# Patient Record
Sex: Female | Born: 1965 | Race: White | Hispanic: No | Marital: Single | Smoking: Former smoker
Health system: Southern US, Community
[De-identification: ages and names within clinical notes are randomized; demographics above are authoritative.]

## PROBLEM LIST (undated history)

## (undated) DIAGNOSIS — Z9889 Other specified postprocedural states: Secondary | ICD-10-CM

## (undated) DIAGNOSIS — I10 Essential (primary) hypertension: Secondary | ICD-10-CM

## (undated) DIAGNOSIS — F419 Anxiety disorder, unspecified: Secondary | ICD-10-CM

## (undated) DIAGNOSIS — M109 Gout, unspecified: Secondary | ICD-10-CM

## (undated) DIAGNOSIS — R7303 Prediabetes: Secondary | ICD-10-CM

## (undated) DIAGNOSIS — F410 Panic disorder [episodic paroxysmal anxiety] without agoraphobia: Secondary | ICD-10-CM

## (undated) HISTORY — PX: MENISCUS REPAIR: SHX5179

## (undated) HISTORY — PX: KNEE ARTHROSCOPY: SUR90

## (undated) HISTORY — PX: ORTHOPEDIC SURGERY: SHX850

## (undated) HISTORY — PX: MENISECTOMY: SHX5181

---

## 2010-04-22 ENCOUNTER — Emergency Department (HOSPITAL_COMMUNITY)
Admission: EM | Admit: 2010-04-22 | Discharge: 2010-04-22 | Disposition: A | Discharge status: Home / Self Care | Payer: Self-pay | Attending: Emergency Medicine | Admitting: Emergency Medicine

## 2010-04-22 DIAGNOSIS — X500XXA Overexertion from strenuous movement or load, initial encounter: Secondary | ICD-10-CM | POA: Insufficient documentation

## 2010-04-22 DIAGNOSIS — IMO0002 Reserved for concepts with insufficient information to code with codable children: Secondary | ICD-10-CM | POA: Insufficient documentation

## 2010-04-22 DIAGNOSIS — Y92838 Other recreation area as the place of occurrence of the external cause: Secondary | ICD-10-CM | POA: Insufficient documentation

## 2010-04-22 DIAGNOSIS — Y9239 Other specified sports and athletic area as the place of occurrence of the external cause: Secondary | ICD-10-CM | POA: Insufficient documentation

## 2011-08-05 ENCOUNTER — Emergency Department (HOSPITAL_COMMUNITY)
Admission: EM | Admit: 2011-08-05 | Discharge: 2011-08-05 | Discharge status: Against Medical Advice | Payer: Self-pay | Attending: Emergency Medicine | Admitting: Emergency Medicine

## 2011-08-05 ENCOUNTER — Emergency Department (HOSPITAL_COMMUNITY): Payer: Self-pay

## 2011-08-05 ENCOUNTER — Encounter (HOSPITAL_COMMUNITY): Payer: Self-pay

## 2011-08-05 DIAGNOSIS — M25529 Pain in unspecified elbow: Secondary | ICD-10-CM | POA: Insufficient documentation

## 2011-08-05 DIAGNOSIS — I10 Essential (primary) hypertension: Secondary | ICD-10-CM | POA: Insufficient documentation

## 2011-08-05 DIAGNOSIS — M109 Gout, unspecified: Secondary | ICD-10-CM | POA: Insufficient documentation

## 2011-08-05 DIAGNOSIS — M25522 Pain in left elbow: Secondary | ICD-10-CM

## 2011-08-05 HISTORY — DX: Essential (primary) hypertension: I10

## 2011-08-05 HISTORY — DX: Gout, unspecified: M10.9

## 2011-08-05 MED ORDER — KETOROLAC TROMETHAMINE 60 MG/2ML IM SOLN
60.0000 mg | Freq: Once | INTRAMUSCULAR | Status: AC
  Administered 2011-08-05: 60 mg via INTRAMUSCULAR
  Filled 2011-08-05: qty 2

## 2011-08-05 NOTE — ED Notes (Signed)
Dr.knapp to see pt.  

## 2011-08-05 NOTE — ED Notes (Signed)
Pt stated that she was given tramadol at [facility] on Sunday and didn't help was given tramadol prescription and has not helped her pain at home.

## 2011-08-05 NOTE — ED Notes (Signed)
Complain of gout in left elbow

## 2011-08-05 NOTE — ED Notes (Signed)
Consent for release of medical information from [facility] signed by pt. Papers given to sec for faxing.  Pt aware.
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
## 2011-08-05 NOTE — ED Notes (Signed)
Patient is no longer in the department, left prior to end of treatment. Did not notify staff

## 2012-07-21 DIAGNOSIS — R079 Chest pain, unspecified: Secondary | ICD-10-CM

## 2016-07-02 ENCOUNTER — Emergency Department (HOSPITAL_COMMUNITY): Payer: Self-pay

## 2016-07-02 ENCOUNTER — Encounter (HOSPITAL_COMMUNITY): Payer: Self-pay | Admitting: Emergency Medicine

## 2016-07-02 ENCOUNTER — Emergency Department (HOSPITAL_COMMUNITY)
Admission: EM | Admit: 2016-07-02 | Discharge: 2016-07-02 | Disposition: A | Discharge status: Home / Self Care | Payer: Self-pay | Attending: Emergency Medicine | Admitting: Emergency Medicine

## 2016-07-02 DIAGNOSIS — S838X1A Sprain of other specified parts of right knee, initial encounter: Secondary | ICD-10-CM

## 2016-07-02 DIAGNOSIS — I10 Essential (primary) hypertension: Secondary | ICD-10-CM | POA: Insufficient documentation

## 2016-07-02 DIAGNOSIS — Y9389 Activity, other specified: Secondary | ICD-10-CM | POA: Insufficient documentation

## 2016-07-02 DIAGNOSIS — M25561 Pain in right knee: Secondary | ICD-10-CM

## 2016-07-02 DIAGNOSIS — Y92009 Unspecified place in unspecified non-institutional (private) residence as the place of occurrence of the external cause: Secondary | ICD-10-CM | POA: Insufficient documentation

## 2016-07-02 DIAGNOSIS — S83206A Unspecified tear of unspecified meniscus, current injury, right knee, initial encounter: Secondary | ICD-10-CM | POA: Insufficient documentation

## 2016-07-02 DIAGNOSIS — Z87891 Personal history of nicotine dependence: Secondary | ICD-10-CM | POA: Insufficient documentation

## 2016-07-02 DIAGNOSIS — X58XXXA Exposure to other specified factors, initial encounter: Secondary | ICD-10-CM | POA: Insufficient documentation

## 2016-07-02 DIAGNOSIS — Y999 Unspecified external cause status: Secondary | ICD-10-CM | POA: Insufficient documentation

## 2016-07-02 MED ORDER — IBUPROFEN 600 MG PO TABS: 600.0000 mg | ORAL_TABLET | Freq: Four times a day (QID) | ORAL | 0 refills | Status: DC | PRN

## 2016-07-02 MED ORDER — HYDROCODONE-ACETAMINOPHEN 5-325 MG PO TABS: 1.0000 | ORAL_TABLET | ORAL | 0 refills | Status: DC | PRN

## 2016-07-02 NOTE — Discharge Instructions (Signed)
Wear the knee immobilizer to protect and rest your knee.  Use ice and elevation as much as possible for the next several days to help reduce the swelling.  Take the medications prescribed.  You may take the hydrocodone prescribed for pain relief.  This will make you drowsy - do not drive within 4 hours of taking this medication.  Use the ibuprofen also for inflammation.  Call the orthopedic doctor listed for a recheck of your injury as soon as possible.

## 2016-07-02 NOTE — ED Triage Notes (Signed)
Pt states that she has had 2 meniscus surgeries in the past.  Pt states that she tried to get up this morning out of bed and it has been getting harder to go up and down the steps at home.  Pt states that her knee locked out on here this morning.
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
## 2016-10-01 ENCOUNTER — Emergency Department (HOSPITAL_COMMUNITY): Payer: Self-pay

## 2016-10-01 ENCOUNTER — Encounter (HOSPITAL_COMMUNITY): Payer: Self-pay | Admitting: Cardiology

## 2016-10-01 ENCOUNTER — Emergency Department (HOSPITAL_COMMUNITY)
Admission: EM | Admit: 2016-10-01 | Discharge: 2016-10-01 | Disposition: A | Discharge status: Home / Self Care | Payer: Self-pay | Attending: Emergency Medicine | Admitting: Emergency Medicine

## 2016-10-01 DIAGNOSIS — M25562 Pain in left knee: Secondary | ICD-10-CM | POA: Insufficient documentation

## 2016-10-01 DIAGNOSIS — I10 Essential (primary) hypertension: Secondary | ICD-10-CM | POA: Insufficient documentation

## 2016-10-01 DIAGNOSIS — Z87891 Personal history of nicotine dependence: Secondary | ICD-10-CM | POA: Insufficient documentation

## 2016-10-01 DIAGNOSIS — Z23 Encounter for immunization: Secondary | ICD-10-CM | POA: Insufficient documentation

## 2016-10-01 HISTORY — DX: Panic disorder (episodic paroxysmal anxiety): F41.0

## 2016-10-01 HISTORY — DX: Anxiety disorder, unspecified: F41.9

## 2016-10-01 MED ORDER — KETOROLAC TROMETHAMINE 30 MG/ML IJ SOLN
15.0000 mg | Freq: Once | INTRAMUSCULAR | Status: AC
  Administered 2016-10-01: 15 mg via INTRAMUSCULAR
  Filled 2016-10-01: qty 1

## 2016-10-01 MED ORDER — TETANUS-DIPHTH-ACELL PERTUSSIS 5-2.5-18.5 LF-MCG/0.5 IM SUSP
0.5000 mL | Freq: Once | INTRAMUSCULAR | Status: AC
  Administered 2016-10-01: 0.5 mL via INTRAMUSCULAR
  Filled 2016-10-01: qty 0.5

## 2016-10-01 MED ORDER — HYDROCODONE-ACETAMINOPHEN 5-325 MG PO TABS: 1.0000 | ORAL_TABLET | Freq: Four times a day (QID) | ORAL | 0 refills | Status: DC | PRN

## 2016-10-01 NOTE — ED Triage Notes (Signed)
Left knee pain times one week.  Denies injury

## 2016-10-01 NOTE — Discharge Instructions (Signed)
Please read instructions below. Apply ice to your knee for 20 minutes at a time. Where your knee immobilizer brace at all times until you have followed up with your orthopedic specialist. You can take advil every 6 hours as needed for pain. Schedule an appointment with your orthopedic specialist within 1 week for follow-up on your injury. Return to the ER for new or concerning symptoms.
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
## 2016-10-17 ENCOUNTER — Encounter (HOSPITAL_COMMUNITY): Payer: Self-pay | Admitting: Emergency Medicine

## 2016-10-17 ENCOUNTER — Emergency Department (HOSPITAL_COMMUNITY)
Admission: EM | Admit: 2016-10-17 | Discharge: 2016-10-17 | Disposition: A | Discharge status: Home / Self Care | Payer: No Typology Code available for payment source | Attending: Emergency Medicine | Admitting: Emergency Medicine

## 2016-10-17 DIAGNOSIS — M545 Low back pain: Secondary | ICD-10-CM | POA: Diagnosis not present

## 2016-10-17 DIAGNOSIS — I10 Essential (primary) hypertension: Secondary | ICD-10-CM | POA: Insufficient documentation

## 2016-10-17 DIAGNOSIS — S3992XA Unspecified injury of lower back, initial encounter: Secondary | ICD-10-CM | POA: Diagnosis present

## 2016-10-17 DIAGNOSIS — Y998 Other external cause status: Secondary | ICD-10-CM | POA: Insufficient documentation

## 2016-10-17 DIAGNOSIS — Y9389 Activity, other specified: Secondary | ICD-10-CM | POA: Diagnosis not present

## 2016-10-17 DIAGNOSIS — Z87891 Personal history of nicotine dependence: Secondary | ICD-10-CM | POA: Diagnosis not present

## 2016-10-17 DIAGNOSIS — Z79899 Other long term (current) drug therapy: Secondary | ICD-10-CM | POA: Diagnosis not present

## 2016-10-17 DIAGNOSIS — Y929 Unspecified place or not applicable: Secondary | ICD-10-CM | POA: Diagnosis not present

## 2016-10-17 MED ORDER — DICLOFENAC SODIUM 50 MG PO TBEC: 50.0000 mg | DELAYED_RELEASE_TABLET | Freq: Two times a day (BID) | ORAL | 0 refills | Status: DC

## 2016-10-17 MED ORDER — METHOCARBAMOL 500 MG PO TABS: 500.0000 mg | ORAL_TABLET | Freq: Two times a day (BID) | ORAL | 0 refills | Status: DC
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
## 2016-10-17 NOTE — Discharge Instructions (Signed)
Return if any problems.
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
## 2016-10-22 ENCOUNTER — Emergency Department (HOSPITAL_COMMUNITY)
Admission: EM | Admit: 2016-10-22 | Discharge: 2016-10-22 | Disposition: A | Discharge status: Home / Self Care | Payer: No Typology Code available for payment source | Attending: Emergency Medicine | Admitting: Emergency Medicine

## 2016-10-22 ENCOUNTER — Encounter (HOSPITAL_COMMUNITY): Payer: Self-pay

## 2016-10-22 DIAGNOSIS — M25511 Pain in right shoulder: Secondary | ICD-10-CM | POA: Insufficient documentation

## 2016-10-22 DIAGNOSIS — Z5321 Procedure and treatment not carried out due to patient leaving prior to being seen by health care provider: Secondary | ICD-10-CM | POA: Insufficient documentation

## 2016-10-22 DIAGNOSIS — M25512 Pain in left shoulder: Secondary | ICD-10-CM | POA: Insufficient documentation

## 2016-10-22 DIAGNOSIS — M542 Cervicalgia: Secondary | ICD-10-CM | POA: Diagnosis present

## 2016-10-22 DIAGNOSIS — M545 Low back pain: Secondary | ICD-10-CM | POA: Insufficient documentation

## 2016-10-22 NOTE — ED Notes (Signed)
Pt not in room upon edp evaluation.

## 2016-10-22 NOTE — ED Notes (Signed)
Pt not in room on this nurse rounding.
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
## 2016-10-29 DIAGNOSIS — M1711 Unilateral primary osteoarthritis, right knee: Secondary | ICD-10-CM | POA: Insufficient documentation

## 2016-11-10 ENCOUNTER — Encounter: Payer: Self-pay | Admitting: General Practice

## 2016-11-10 ENCOUNTER — Ambulatory Visit: Payer: Self-pay | Admitting: Family Medicine

## 2017-04-29 DIAGNOSIS — M2341 Loose body in knee, right knee: Secondary | ICD-10-CM | POA: Insufficient documentation

## 2017-05-14 DIAGNOSIS — Z9889 Other specified postprocedural states: Secondary | ICD-10-CM | POA: Insufficient documentation

## 2018-04-04 ENCOUNTER — Encounter (HOSPITAL_COMMUNITY): Payer: Self-pay

## 2018-04-04 ENCOUNTER — Other Ambulatory Visit: Payer: Self-pay

## 2018-04-04 ENCOUNTER — Emergency Department (HOSPITAL_COMMUNITY)
Admission: EM | Admit: 2018-04-04 | Discharge: 2018-04-04 | Disposition: A | Discharge status: Home / Self Care | Payer: Self-pay | Attending: Emergency Medicine | Admitting: Emergency Medicine

## 2018-04-04 DIAGNOSIS — M545 Low back pain, unspecified: Secondary | ICD-10-CM

## 2018-04-04 DIAGNOSIS — Z87891 Personal history of nicotine dependence: Secondary | ICD-10-CM | POA: Insufficient documentation

## 2018-04-04 DIAGNOSIS — I10 Essential (primary) hypertension: Secondary | ICD-10-CM | POA: Insufficient documentation

## 2018-04-04 DIAGNOSIS — Z79899 Other long term (current) drug therapy: Secondary | ICD-10-CM | POA: Insufficient documentation

## 2018-04-04 MED ORDER — KETOROLAC TROMETHAMINE 30 MG/ML IJ SOLN
30.0000 mg | Freq: Once | INTRAMUSCULAR | Status: AC
  Administered 2018-04-04: 30 mg via INTRAMUSCULAR
  Filled 2018-04-04: qty 1

## 2018-04-04 MED ORDER — DEXAMETHASONE SODIUM PHOSPHATE 10 MG/ML IJ SOLN
10.0000 mg | Freq: Once | INTRAMUSCULAR | Status: AC
  Administered 2018-04-04: 10 mg via INTRAMUSCULAR
  Filled 2018-04-04: qty 1

## 2018-04-04 NOTE — ED Provider Notes (Signed)
[facility] EMERGENCY DEPARTMENT Provider Note   CSN: [id number] Arrival date & time: 04/04/18  4098     History   Chief Complaint Chief Complaint  Patient presents with  . Back Pain    HPI Taylor Ingram is a 53 y.o. female with history of hypertension, gout presenting to emergency department with chief complaint of back pain x3 weeks.  She describes the pain is located in her right lower back and radiates across to her left lower back.  She denies recent injury.  She describes the pain as sharp and constant.  She rates the pain 10 of 10 severity.  She has a history of chronic right knee pain stating that she is "bone-on-bone."  She has been taking Tylenol, hydrocodone, and over-the-counter lidocaine patches without relief. Patient works as a Financial risk analyst and she reports when standing for long hours she has pain radiating down her right leg. Denies weight loss, fever, neck pain, history of cancer, IV drug use, urinary retention, bowel or bladder incontinence, numbness or tingling.    Past Medical History:  Diagnosis Date  . Anxiety   . Gout   . Hypertension   . Panic attack     There are no active problems to display for this patient.   Past Surgical History:  Procedure Laterality Date  . KNEE ARTHROSCOPY    . MENISCUS REPAIR    . MENISECTOMY    . ORTHOPEDIC SURGERY       OB History   No obstetric history on file.      Home Medications    Prior to Admission medications   Medication Sig Start Date End Date Taking? Authorizing Provider  diclofenac (VOLTAREN) 50 MG EC tablet Take 1 tablet (50 mg total) by mouth 2 (two) times daily. 10/17/16   Elson Areas, PA-C  hydrochlorothiazide (HYDRODIURIL) 25 MG tablet Take 25 mg by mouth daily.    [provider]  HYDROcodone-acetaminophen (NORCO/VICODIN) 5-325 MG tablet Take 1 tablet by mouth every 6 (six) hours as needed for severe pain. Patient not taking: Reported on 10/22/2016 10/01/16   Robinson, [place] N, PA-C    ibuprofen (ADVIL,MOTRIN) 600 MG tablet Take 1 tablet (600 mg total) by mouth every 6 (six) hours as needed. Patient not taking: Reported on 10/22/2016 07/02/16   Burgess Amor, PA-C  methocarbamol (ROBAXIN) 500 MG tablet Take 1 tablet (500 mg total) by mouth 2 (two) times daily. 10/17/16   Elson Areas, PA-C  sertraline (ZOLOFT) 50 MG tablet Take 50 mg by mouth daily.    [provider]    Family History No family history on file.  Social History Social History   Tobacco Use  . Smoking status: Former Smoker    Types: Cigarettes    Last attempt to quit: 2016    Years since quitting: 4.1  . Smokeless tobacco: Never Used  Substance Use Topics  . Alcohol use: No  . Drug use: No     Allergies   Penicillins and Sulfa antibiotics   Review of Systems Review of Systems  Musculoskeletal: Positive for back pain. Negative for gait problem, neck pain and neck stiffness.  All other systems reviewed and are negative.    Physical Exam Updated Vital Signs BP (!) 150/96   Pulse 78   Temp 97.7 F (36.5 C) (Oral)   Resp 18   Ht 5\' 9"  (1.753 m)   Wt 114.8 kg   SpO2 99%   BMI 37.36 kg/m   Physical Exam  Vitals signs and nursing note reviewed.  Constitutional:      Appearance: She is not ill-appearing or toxic-appearing.  HENT:     Head: Normocephalic and atraumatic.     Nose: Nose normal.     Mouth/Throat:     Mouth: Mucous membranes are moist.     Pharynx: Oropharynx is clear.  Eyes:     General: No scleral icterus.    Conjunctiva/sclera: Conjunctivae normal.  Neck:     Musculoskeletal: Normal range of motion. No muscular tenderness.  Cardiovascular:     Rate and Rhythm: Normal rate and regular rhythm.     Pulses: Normal pulses.     Heart sounds: Normal heart sounds.  Pulmonary:     Effort: Pulmonary effort is normal.     Breath sounds: Normal breath sounds.  Abdominal:     General: There is no distension.     Palpations: Abdomen is soft.     Tenderness:  There is no abdominal tenderness. There is no guarding or rebound.  Musculoskeletal: Normal range of motion.     Comments: Non tender over spinous processes. No step off or deformity.  Skin:    General: Skin is warm and dry.     Capillary Refill: Capillary refill takes less than 2 seconds.  Neurological:     Mental Status: She is alert. Mental status is at baseline.     Comments: Speech is clear and goal oriented, follows commands CN III-XII intact, no facial droop Normal strength in upper and lower extremities bilaterally including dorsiflexion and plantar flexion, strong and equal grip strength Sensation normal to light and sharp touch Moves extremities without ataxia, coordination intact Normal finger to nose and rapid alternating movements Mildly antalgic gait. Normal balance   Psychiatric:        Behavior: Behavior normal.      ED Treatments / Results  Labs (all labs ordered are listed, but only abnormal results are displayed) Labs Reviewed - No data to display  EKG None  Radiology No results found.  Procedures Procedures (including critical care time)  Medications Ordered in ED Medications  ketorolac (TORADOL) 30 MG/ML injection 30 mg (30 mg Intramuscular Given 04/04/18 1000)  dexamethasone (DECADRON) injection 10 mg (10 mg Intramuscular Given 04/04/18 1000)     Initial Impression / Assessment and Plan / ED Course  I have reviewed the triage vital signs and the nursing notes.  Pertinent labs & imaging results that were available during my care of the patient were reviewed by me and considered in my medical decision making (see chart for details).    Patient with back pain.  No neurological deficits and normal neuro exam.  Patient can walk but states is painful.  No loss of bowel or bladder control.  No concern for cauda equina.  No fever, night sweats, weight loss, h/o cancer, IVDU.   She was seen recently at another emergency department for this pain and was  given hydrocodone that has not relieved her pain.  She is also been taking Tylenol without relief.  Will give IM Toradol and Decadron today because she has already tried multiple other OTC treatments. Patient given [facility] information at discharge and recommend following up in 1 week if her symptoms worsen. RICE protocol and ibuprofen indicated and discussed with patient.   Pt case discussed with Dr. Jacqulyn BathLong who agrees with my plan.   Final Clinical Impressions(s) / ED Diagnoses   Final diagnoses:  Acute bilateral low back  pain, unspecified whether sciatica present    ED Discharge Orders    None       Kathyrn Lass 04/04/18 2023    Maia Plan, MD 04/06/18 [phone]

## 2018-04-04 NOTE — Discharge Instructions (Signed)
You have been seen in the Emergency Department (ED)  today for back pain.  Your workup and exam have not shown any acute abnormalities and you are likely suffering from muscle strain or possible problems with your discs, but there is no treatment that will fix your symptoms at this time.  Please take Motrin (ibuprofen) as needed for your pain according to the instructions written on the box.  Alternatively, for the next five days you can take 600mg  three times daily with meals (it may upset your stomach). You can also try over-the-counter lidocaine gel or patches.  Please follow up with your doctor as soon as possible regarding today's ED visit and your back pain.  Return to the ED for worsening back pain, fever, weakness or numbness of either leg, or if you develop either (1) an inability to urinate or have bowel movements, or (2) loss of your ability to control your bathroom functions (if you start having "accidents"), or if you develop other new symptoms that concern you.

## 2018-04-04 NOTE — ED Triage Notes (Signed)
Pt c/o pain in r lower back x 3 weeks.  Denies injury.  Reports chronic r knee pain and thinks pain may be coming from compensating for her knee.  Denies urinary symptoms.

## 2019-06-05 IMAGING — DX DG KNEE COMPLETE 4+V*L*
4 series · 4 of 4 positions shown · non-contrast
Comparison: None in PACs

CLINICAL DATA: One month of left knee pain with episodes of locking
over the past 4 5 days. The patient suspects that she has been
placing more weight on the left knee due to issues with the right
knee. No discrete episode of injury.

EXAM:
LEFT KNEE - COMPLETE 4+ VIEW

[knee ap]
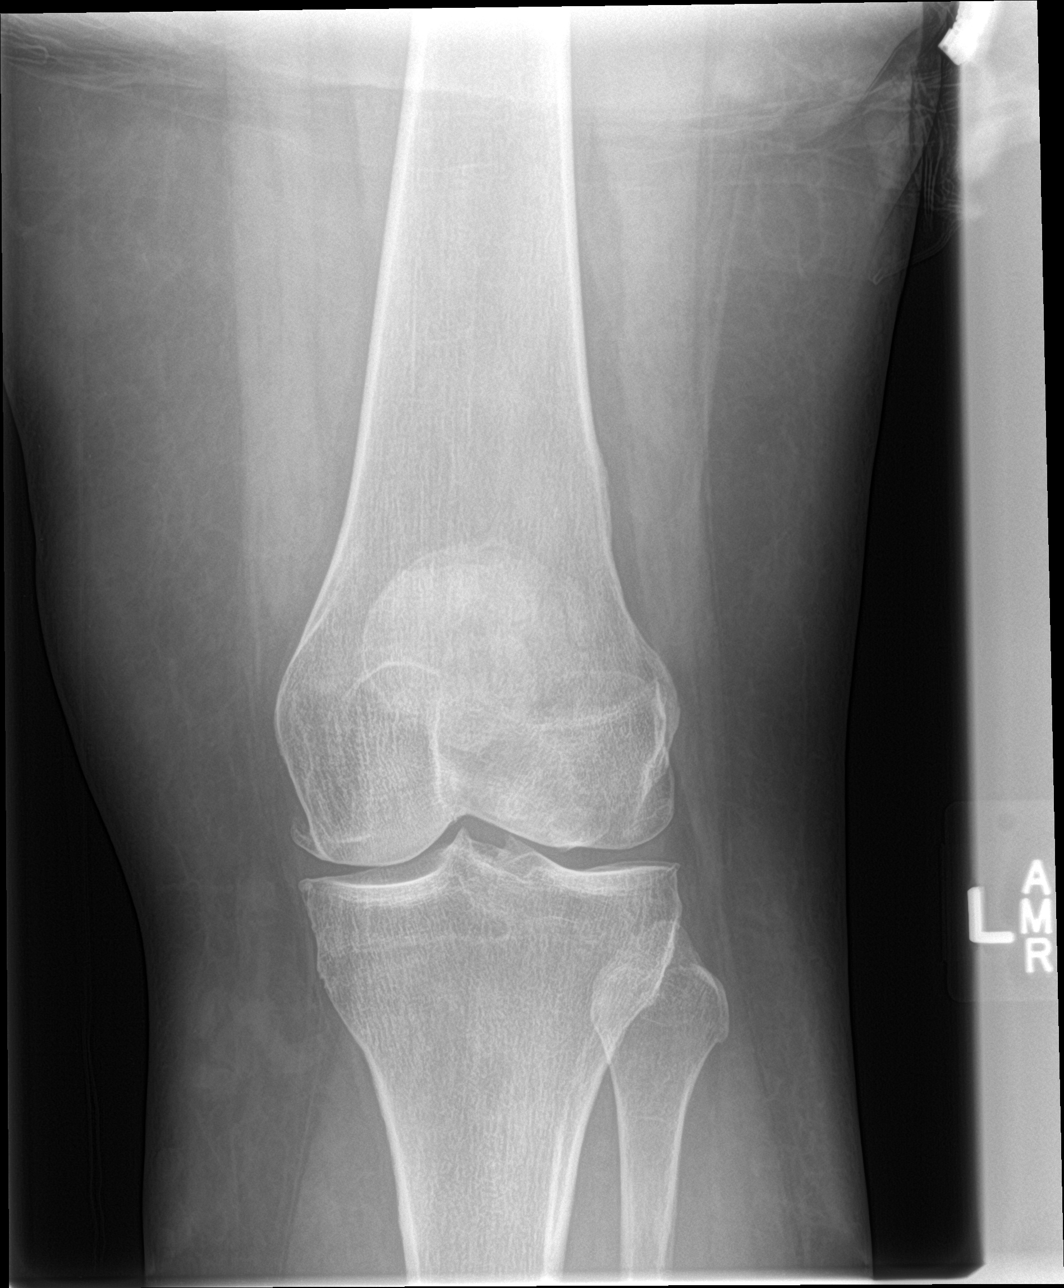

[tunnel]
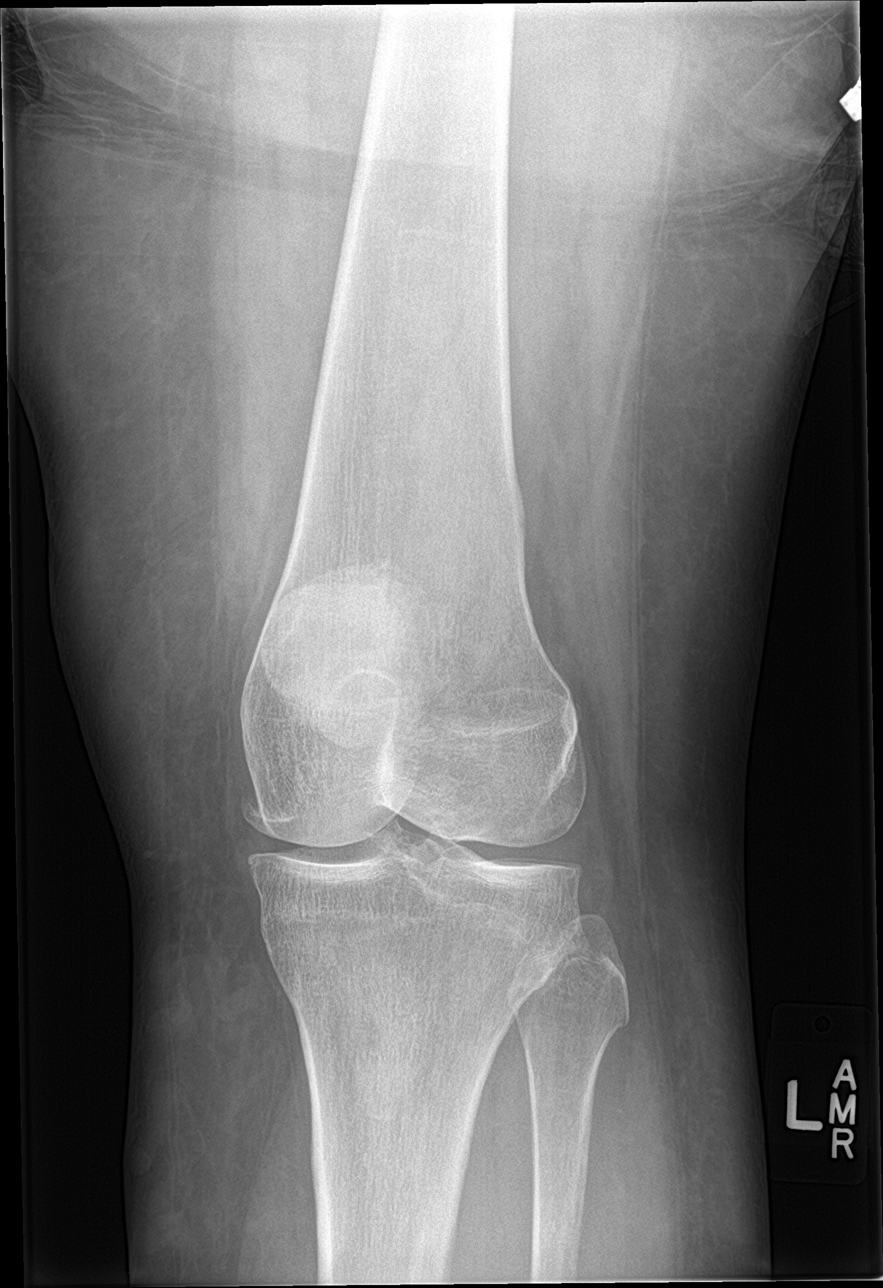

[knee lat (1 of 2)]
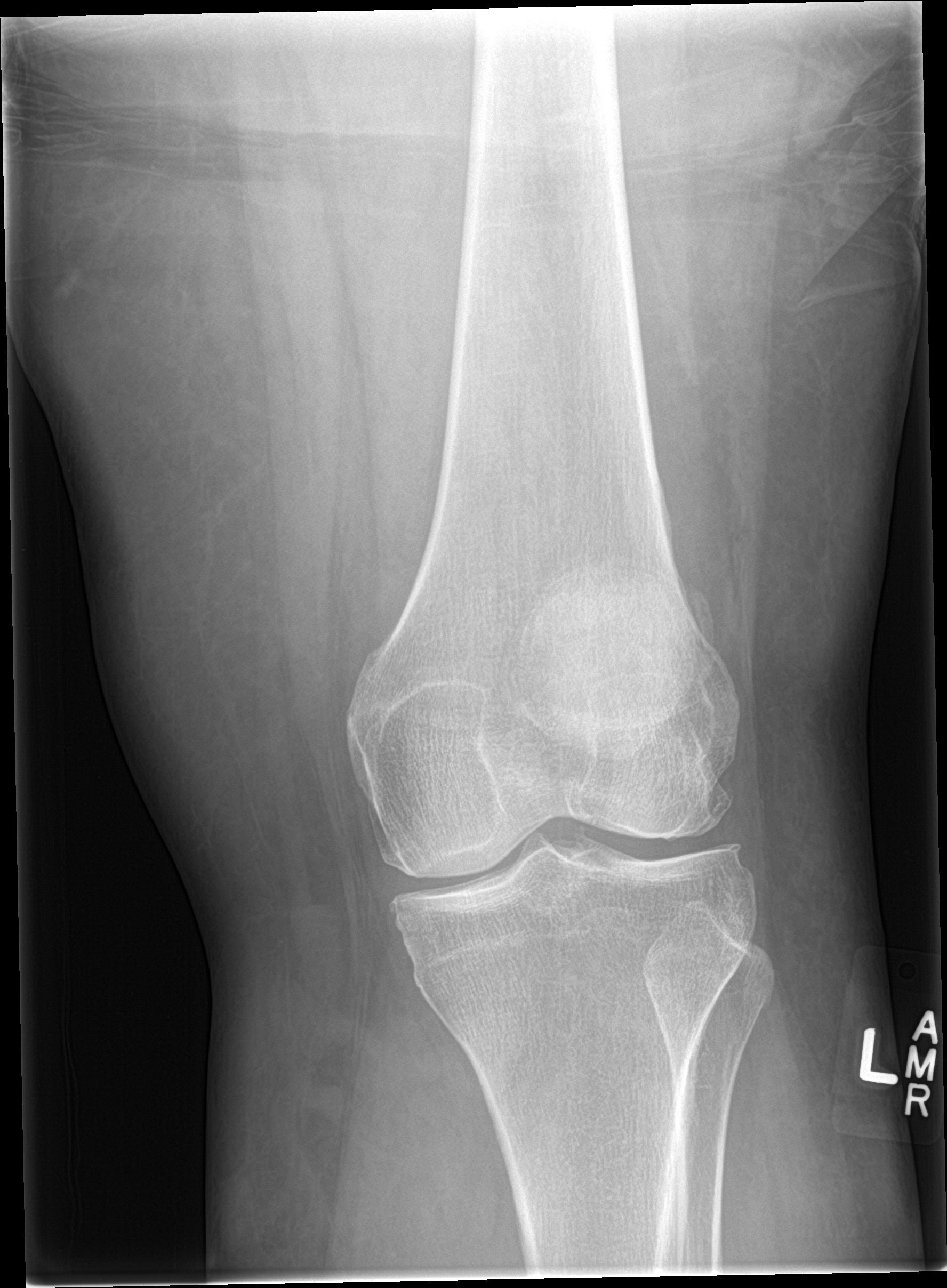

[knee lat (2 of 2)]
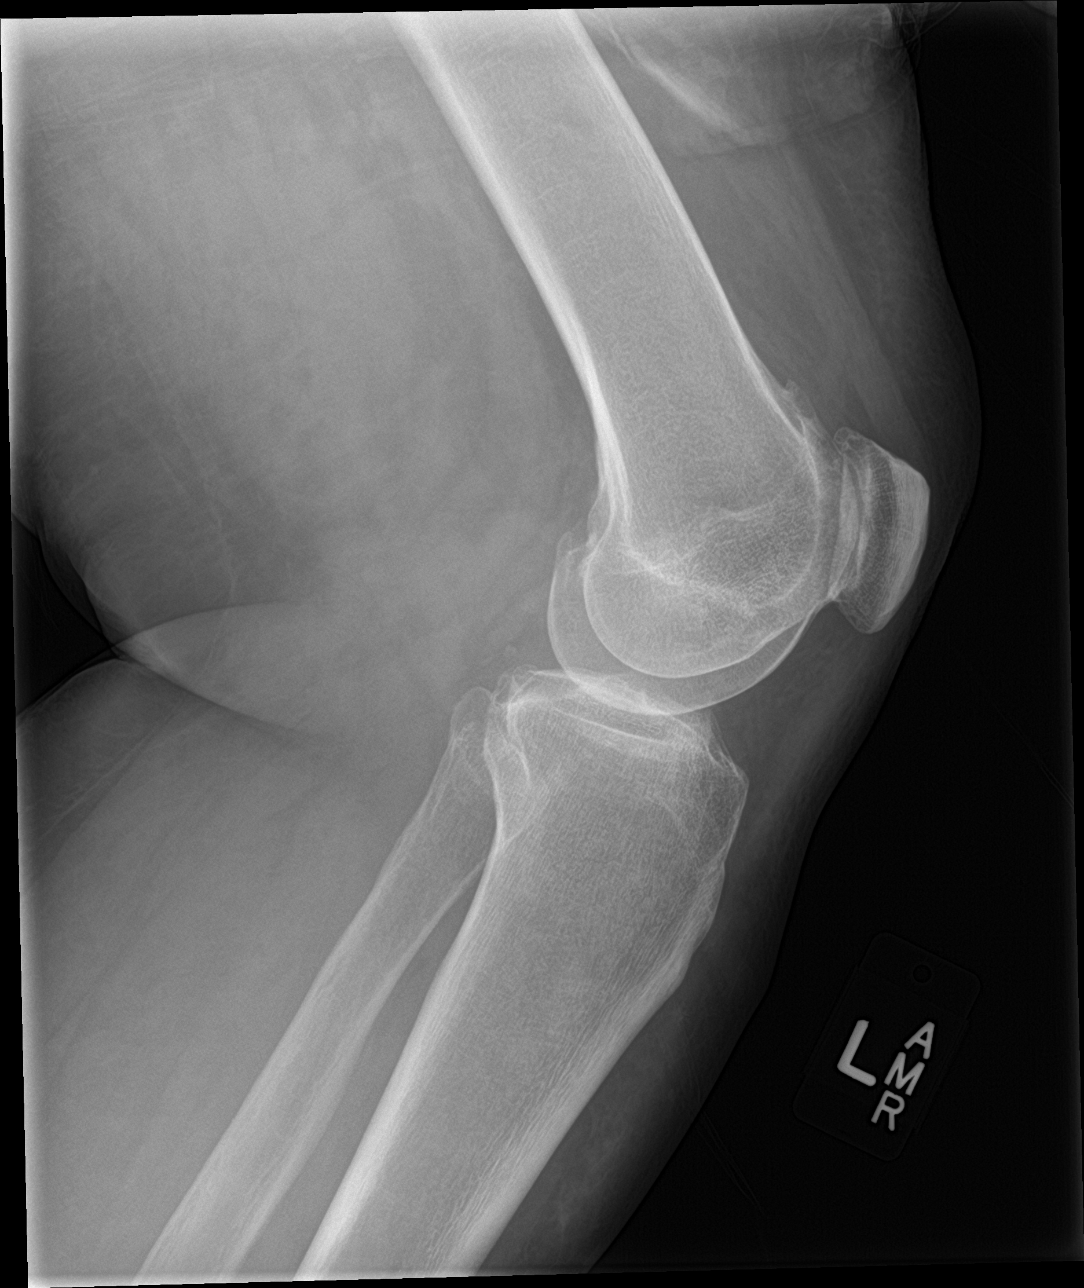

[4 of 4 positions shown; findings below may reference images not displayed]

FINDINGS: The bones are subjectively adequately mineralized. The joint spaces
are reasonably well-maintained. There is mild beaking of the tibial
spines. Tiny spurs arise from the articular margins of the medial
femoral condyles and the tibial plateaus. There is no acute fracture
or dislocation. There is no joint effusion.
IMPRESSION: Mild osteoarthritic spurring of the tibial spines and elsewhere as
described. No acute bony abnormality. Given the persistent symptoms,
MRI would be a useful next imaging step.

## 2021-10-30 DIAGNOSIS — Z882 Allergy status to sulfonamides status: Secondary | ICD-10-CM | POA: Diagnosis not present

## 2021-10-30 DIAGNOSIS — F419 Anxiety disorder, unspecified: Secondary | ICD-10-CM | POA: Diagnosis not present

## 2021-10-30 DIAGNOSIS — R519 Headache, unspecified: Secondary | ICD-10-CM | POA: Diagnosis not present

## 2021-10-30 DIAGNOSIS — J309 Allergic rhinitis, unspecified: Secondary | ICD-10-CM | POA: Diagnosis not present

## 2021-10-30 DIAGNOSIS — I10 Essential (primary) hypertension: Secondary | ICD-10-CM | POA: Diagnosis not present

## 2021-10-30 DIAGNOSIS — Z91040 Latex allergy status: Secondary | ICD-10-CM | POA: Diagnosis not present

## 2021-10-30 DIAGNOSIS — Z20822 Contact with and (suspected) exposure to covid-19: Secondary | ICD-10-CM | POA: Diagnosis not present

## 2021-10-30 DIAGNOSIS — K219 Gastro-esophageal reflux disease without esophagitis: Secondary | ICD-10-CM | POA: Diagnosis not present

## 2021-10-30 DIAGNOSIS — B9789 Other viral agents as the cause of diseases classified elsewhere: Secondary | ICD-10-CM | POA: Diagnosis not present

## 2021-10-30 DIAGNOSIS — Z79899 Other long term (current) drug therapy: Secondary | ICD-10-CM | POA: Diagnosis not present

## 2021-10-30 DIAGNOSIS — R059 Cough, unspecified: Secondary | ICD-10-CM | POA: Diagnosis not present

## 2021-10-30 DIAGNOSIS — Z880 Allergy status to penicillin: Secondary | ICD-10-CM | POA: Diagnosis not present

## 2021-10-30 DIAGNOSIS — J069 Acute upper respiratory infection, unspecified: Secondary | ICD-10-CM | POA: Diagnosis not present

## 2021-10-30 DIAGNOSIS — Z885 Allergy status to narcotic agent status: Secondary | ICD-10-CM | POA: Diagnosis not present

## 2022-01-08 DIAGNOSIS — R0789 Other chest pain: Secondary | ICD-10-CM | POA: Diagnosis not present

## 2022-01-08 DIAGNOSIS — R079 Chest pain, unspecified: Secondary | ICD-10-CM | POA: Diagnosis not present

## 2022-01-08 DIAGNOSIS — R69 Illness, unspecified: Secondary | ICD-10-CM | POA: Diagnosis not present

## 2022-01-21 DIAGNOSIS — Z79899 Other long term (current) drug therapy: Secondary | ICD-10-CM | POA: Diagnosis not present

## 2022-01-21 DIAGNOSIS — M25561 Pain in right knee: Secondary | ICD-10-CM | POA: Diagnosis not present

## 2022-01-21 DIAGNOSIS — Z885 Allergy status to narcotic agent status: Secondary | ICD-10-CM | POA: Diagnosis not present

## 2022-01-21 DIAGNOSIS — Z880 Allergy status to penicillin: Secondary | ICD-10-CM | POA: Diagnosis not present

## 2022-01-21 DIAGNOSIS — M1711 Unilateral primary osteoarthritis, right knee: Secondary | ICD-10-CM | POA: Diagnosis not present

## 2022-01-21 DIAGNOSIS — Z882 Allergy status to sulfonamides status: Secondary | ICD-10-CM | POA: Diagnosis not present

## 2022-01-21 DIAGNOSIS — I10 Essential (primary) hypertension: Secondary | ICD-10-CM | POA: Diagnosis not present

## 2022-01-21 DIAGNOSIS — Z91040 Latex allergy status: Secondary | ICD-10-CM | POA: Diagnosis not present

## 2022-03-18 ENCOUNTER — Telehealth: Payer: Self-pay | Admitting: Orthopedic Surgery

## 2022-03-18 NOTE — Telephone Encounter (Signed)
Returned the patient's call and lvm for her to let her know that we did receive her referral and it will need to be reviewed by Dr. Aline Brochure first.  Once he advises we'll reach out to her.  Pt's # [phone]

## 2022-03-21 ENCOUNTER — Telehealth: Payer: Self-pay

## 2022-03-21 NOTE — Telephone Encounter (Signed)
Taylor Ingram called wanting to setup an appointment. Stated that she was referred by Dr. Ursula Beath in [place] to Dr. Aline Brochure. I told her we don't have a referral yet, after talking with her I found out she has had 3 Knee surgeries and she wants to see Dr. Aline Brochure about a knee replacement. She fell on her bad knee Wednesday 03/19/22 and went to [facility] ER and they put her in a brace. I told her it would be a 2nd opinion and I explained our protocol about 2nd opinions. She stated that she will go by the [facility] office on Monday and get them to fax her notes to me, and she will get the cd from [facility] to bring with her if Dr. Aline Brochure agrees to see her. She is agreeable to all of this information.

## 2022-03-24 ENCOUNTER — Encounter: Payer: Self-pay | Admitting: Orthopedic Surgery

## 2022-04-07 ENCOUNTER — Ambulatory Visit: Payer: Medicaid Other | Admitting: Orthopedic Surgery

## 2022-04-07 ENCOUNTER — Ambulatory Visit (INDEPENDENT_AMBULATORY_CARE_PROVIDER_SITE_OTHER): Payer: Medicaid Other

## 2022-04-07 ENCOUNTER — Encounter: Payer: Self-pay | Admitting: Orthopedic Surgery

## 2022-04-07 VITALS — BP 155/100 | HR 84 | Ht 69.0 in | Wt 253.0 lb

## 2022-04-07 DIAGNOSIS — Z01818 Encounter for other preprocedural examination: Secondary | ICD-10-CM | POA: Diagnosis not present

## 2022-04-07 DIAGNOSIS — G8929 Other chronic pain: Secondary | ICD-10-CM

## 2022-04-07 DIAGNOSIS — M1711 Unilateral primary osteoarthritis, right knee: Secondary | ICD-10-CM

## 2022-04-07 MED ORDER — BUPIVACAINE-MELOXICAM ER 400-12 MG/14ML IJ SOLN: 400.0000 mg | Freq: Once | INTRAMUSCULAR | Status: DC

## 2022-04-07 NOTE — Addendum Note (Signed)
Addended byCandice Camp on: 04/07/2022 12:10 PM   Modules accepted: Orders

## 2022-04-07 NOTE — Progress Notes (Signed)
Chief Complaint  Patient presents with   Knee Pain    Right has xray CD xrays done end of January 2024 patient states unable to put full weight on right leg/knee   Please see the notes noted below.  Patient was sent to me by Dr. Dwain Sarna as the total joint replacement at his hospital are being deferred secondary to some infection issues  The patient tells me that Taylor Ingram is having diffuse right knee pain and inability to weight-bear.  This is also causing her some anxiety and depression and Taylor Ingram is taking escitalopram 10 mg  Taylor Ingram is using a cane  As indicated Taylor Ingram has had multiple treatments and surgeries  Past Surgical History:  Procedure Laterality Date   KNEE ARTHROSCOPY     MENISCUS REPAIR     MENISECTOMY     ORTHOPEDIC SURGERY     History reviewed. No pertinent family history.  Social History   Tobacco Use   Smoking status: Former    Types: Cigarettes    Quit date: 2016    Years since quitting: 8.1   Smokeless tobacco: Never  Vaping Use   Vaping Use: Never used  Substance Use Topics   Alcohol use: No   Drug use: No    Past Medical History:  Diagnosis Date   Anxiety    Gout    Hypertension    Panic attack     Taylor Ingram indicates on review of systems that Taylor Ingram has felt tired heart palpitations pain in her legs after walking shortness of breath heartburn frequency joint pain depression nervousness pollen allergies  Taylor Ingram has had a history of pneumonia hypertension GERD depression anxiety arthritis bronchitis     Patient is a pleasant 57 year old female who comes in today for follow-up of her chronic right knee pain in the setting of degenerative arthritis. Her history is previously well-documented but briefly, Taylor Ingram has a history of 3 arthroscopic surgeries for meniscectomies and chondroplasties, her most recent performed proximally 5-6 years ago by Dr. Case. Her most recent surgery provided her no relief though Taylor Ingram has previously been told that Taylor Ingram would need a knee replacement at  some point. Since then, Taylor Ingram has been managed nonoperatively with a variety of measures including oral medications as well as steroid injections. Her most recent steroid injection provided her little to no relief and since then, Taylor Ingram has had significant 10/10 pain to her knee that Taylor Ingram describes as a constant sharp and throbbing pain that does interfere with her ability to sleep at night. Taylor Ingram takes ibuprofen and Tylenol for pain which helps minimally. Taylor Ingram denies any numbness or tingling. 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 Taylor Ingram does occasionally use a cane to assist with ambulation.  ED note from 01/21/22: Taylor Ingram is a 57 y.o. female who presents today to the emergency department complaining of increased right knee pain and swelling this morning. Taylor Ingram has a history of chronic knee problems and reports having had 3 arthroscopies on her right knee, but states that Taylor Ingram woke up this morning and had increased pain, swelling and difficulty straightening her knee. Taylor Ingram rates the pain as moderate.   Last clinic note from 06/18/20: Patient is a very pleasant 57 year old female who presents today for ongoing right knee pain and swelling. Patient has a history of knee arthroscopy performed by Dr. Case here in [place]. Please see note below. Patient states Taylor Ingram has ongoing pain to the right knee. Taylor Ingram  does endorse intermittent effusions. Patient has a noted antalgic gait and limps. Taylor Ingram walks without an assistive device. No recent interval trauma or falls. Patient was unable to continue work due to ongoing right knee pain.    BP (!) 155/100   Pulse 84   Ht 5' 9"$  (1.753 m)   Wt 253 lb (114.8 kg)   BMI 37.36 kg/m   Physical Exam Vitals and nursing note reviewed.  Constitutional:      Appearance: Normal appearance.  HENT:     Head: Normocephalic and atraumatic.  Eyes:     General: No scleral icterus.       Right eye: No discharge.        Left eye: No  discharge.     Extraocular Movements: Extraocular movements intact.     Conjunctiva/sclera: Conjunctivae normal.     Pupils: Pupils are equal, round, and reactive to light.  Cardiovascular:     Rate and Rhythm: Normal rate.     Pulses: Normal pulses.  Musculoskeletal:     Right lower leg: Edema present.     Left lower leg: Edema present.     Comments: The right knee appears to be in valgus.  The effusion  0-90 active and passive  The tenderness is in the diffuse around the patellofemoral joint lateral compartment medial compartment  Extensor mechanism is intact  All 4 ligaments are stable    Skin:    General: Skin is warm and dry.     Capillary Refill: Capillary refill takes less than 2 seconds.  Neurological:     General: No focal deficit present.     Mental Status: Taylor Ingram is alert and oriented to person, place, and time.  Psychiatric:        Mood and Affect: Mood normal.        Behavior: Behavior normal.        Thought Content: Thought content normal.        Judgment: Judgment normal.    Images were obtained via a disc  I had to repeat them because there was no patellofemoral view to plan for surgery  The images that we have on the discs show grade 4 arthritis mild valgus deformity notable osteophytes and subchondral sclerosis  New images  Grade 4 arthritis lateral compartment mild valgus with multiple osteophytes normal patellofemoral tracking and alignment with peripheral osteophytes  Assessment and plan  57 year old female the plan is to do a right total knee for right knee arthritis  I have informed her that Taylor Ingram is at high risk.  Taylor Ingram is just meeting the BMI criteria with a BMI of 37, Taylor Ingram is recently diagnosed with depression but is on medication, usual risk of DVT postop pain and stiffness with only preop 90 degree flexion.

## 2022-04-07 NOTE — Patient Instructions (Signed)
Your surgery will be at [facility] by Dr Aline Brochure  plan to be in hospital overnight. The hospital will contact you with a preoperative appointment to discuss Anesthesia.  Please arrive on time or 15 minutes early for the preoperative appointment, they have a very tight schedule if you are late or do not come in your surgery will be cancelled.  The phone number for the preop area is [phone]. Please bring your medications with you for the appointment. They will tell you the arrival time for surgery and medication instructions when you have your preoperative evaluation. Do not wear nail polish the day of your surgery and if you take Phentermine you need to stop this medication ONE WEEK prior to your surgery. f you take Augustina Mood, Jardiance, or Steglatro) - Hold 72 hours before the procedure.  If you take Ozempic,  Bydureon or Trulicity do not take for 8 days before your surgery. If you take Victoza, Rybelsis, Saxenda or Adlyxi stop 24 hours before the procedure. Please arrive at the hospital 2 hours before procedure if scheduled at 9:30 or later in the day or at the time the nurse tells you at your preoperative visit.   If you have my chart do not use the time given in my chart use the time given to you by the nurse during your preoperative visit.   Your surgery  time may change. Please be available for phone calls the day of your surgery and the day before. The Short Stay department may need to discuss changes about your surgery time. Not reaching the you could lead to procedure delays and possible cancellation.  You must have a ride home and someone to stay with you for 24 to 48 hours. The person taking you home will receive and sign for the your discharge instructions.  Please be prepared to give your support person's name and telephone number to [employer]. Dr Aline Brochure will need that name and phone number post procedure.   You will also get a call from a representative of [employer], they have a machine that you will use in the first few weeks after surgery. It is called a CPM.   You will have home physical therapy for 2 weeks after surgery, the home health agency will call you before or just following the surgery to set up visits. [facility] is the agency we normally use, unless you request another agency.   You will get a call also from outpatient therapy for therapy starting when the home therapy is done.  If you have questions or need to Reschedule the surgery, call the office ask for Carlen Rebuck.

## 2022-04-17 ENCOUNTER — Encounter: Payer: Self-pay | Admitting: Radiology

## 2022-04-30 ENCOUNTER — Other Ambulatory Visit: Payer: Self-pay | Admitting: Orthopedic Surgery

## 2022-04-30 DIAGNOSIS — M1711 Unilateral primary osteoarthritis, right knee: Secondary | ICD-10-CM

## 2022-04-30 NOTE — Patient Instructions (Signed)
Taylor Ingram  04/30/2022     '@[email or web address]'$ @   Your procedure is scheduled on  05/08/2021.   Report to [facility] at  0600  A.M.   Call this number if you have problems the morning of surgery:  [phone]  If you experience any cold or flu symptoms such as cough, fever, chills, shortness of breath, etc. between now and your scheduled surgery, please notify us at the above number.   Remember:  Do not eat or drink after midnight.      Take these medicines the morning of surgery with A SIP OF WATER                                 lexapro, omeprazole.    Do not wear jewelry, make-up or nail polish.  Do not wear lotions, powders, or perfumes, or deodorant.  Do not shave 48 hours prior to surgery.  Men may shave face and neck.  Do not bring valuables to the hospital.  [facility] is not responsible for any belongings or valuables.  Contacts, dentures or bridgework may not be worn into surgery.  Leave your suitcase in the car.  After surgery it may be brought to your room.  For patients admitted to the hospital, discharge time will be determined by your treatment team.  Patients discharged the day of surgery will not be allowed to drive home.    Special instructions:   DO NOT smoke tobacco or vape for 24 hours before your procedure.  Please read over the following fact sheets that you were given. Pain Booklet, Coughing and Deep Breathing, Blood Transfusion Information, Total Joint Packet, MRSA Information, Surgical Site Infection Prevention, Anesthesia Post-op Instructions, and Care and Recovery After Surgery      Total Knee Replacement, Care After This sheet gives you information about how to care for yourself after your procedure. Your health care provider may also give you more specific instructions. If you have problems or questions, contact your health care provider. What can I expect after the procedure? After the procedure, it is common to  have: Redness, pain, and swelling at the incision area. Stiffness. Discomfort. A small amount of blood or clear fluid coming from your incision. Follow these instructions at home: Medicines Take over-the-counter and prescription medicines only as told by your health care provider. If you were prescribed a blood thinner (anticoagulant), take it as told by your health care provider. Ask your health care provider if the medicine prescribed to you: Requires you to avoid driving or using machinery. Can cause constipation. You may need to take these actions to prevent or treat constipation: Drink enough fluid to keep your urine pale yellow. Take over-the-counter or prescription medicines. Eat foods that are high in fiber, such as beans, whole grains, and fresh fruits and vegetables. Limit foods that are high in fat and processed sugars, such as fried or sweet foods. Incision care  Follow instructions from your health care provider about how to take care of your incision. Make sure you: Wash your hands with soap and water for at least 20 seconds before and after you change your bandage (dressing). If soap and water are not available, use hand sanitizer. Change your dressing as told by your health care provider. Leave stitches (sutures), staples, skin glue, or adhesive strips in place. These skin closures may need to stay in place  for 2 weeks or longer. If adhesive strip edges start to loosen and curl up, you may trim the loose edges. Do not remove adhesive strips completely unless your health care provider tells you to do that. Do not take baths, swim, or use a hot tub until your health care provider approves. Check your incision area every day for signs of infection. Check for: More redness, swelling, or pain. More fluid or blood. Warmth. Pus or a bad smell. Activity Rest as told by your health care provider. Avoid sitting for a long time without moving. Get up to take short walks every 1-2  hours. This is important to improve blood flow and breathing. Ask for help if you feel weak or unsteady. Follow instructions from your health care provider about using a walker, crutches, or a cane. You may use your legs to support (bear) your body weight as told by your health care provider. Follow instructions about how much weight you may safely support on your affected leg (weight-bearing restrictions). A physical therapist may show you how to get out of a bed and chair and how to go up and down stairs. You will first do this with a walker, crutches, or a cane and then without any of these devices. Once you are able to walk without a limp, you may stop using a walker, crutches, or a cane. Do exercises as told by your health care provider or physical therapist. Avoid high-impact activities, including running, jumping rope, and doing jumping jacks. Do not play contact sports until your health care provider approves. Return to your normal activities as told by your health care provider. Ask your health care provider what activities are safe for you. Managing pain, stiffness, and swelling  If directed, put ice on your knee. To do this: Put ice in a plastic bag or use the icing device (cold flow pad) that you were given. Follow instructions from your health care provider about how to use the icing device. Place a towel between your skin and the bag or between your skin and the icing device. Leave the ice on for 20 minutes, 2-3 times a day. Remove the ice if your skin turns bright red. This is very important. If you cannot feel pain, heat, or cold, you have a greater risk of damage to the area. Move your toes often to reduce stiffness and swelling. Raise (elevate) your leg above the level of your heart while you are sitting or lying down. Use several pillows to keep your leg straight. Do not put a pillow just under the knee. If the knee is bent for a long time, this may lead to stiffness. Wear  elastic knee support as told by your health care provider. Safety  To help prevent falls, keep floors clear of objects you may trip over. Place items that you may need within easy reach. Wear an apron or tool belt with pockets for carrying objects. This leaves your hands free to help with your balance. Ask your health care provider when it is safe to drive. General instructions Wear compression stockings as told by your health care provider. These stockings help to prevent blood clots and reduce swelling in your legs. Continue with breathing exercises. This helps prevent lung infection. Do not use any products that contain nicotine or tobacco. These products include cigarettes, chewing tobacco, and vaping devices, such as e-cigarettes. These can delay healing after surgery. If you need help quitting, ask your health care provider. Tell your health care  provider if you plan to have dental work. Also: Tell your dentist about your joint replacement. Ask your health care provider if there are any special instructions you need to follow before having dental care and routine cleanings. Keep all follow-up visits. This is important. Contact a health care provider if: You have a fever or chills. You have a cough or feel short of breath. Your medicine is not controlling your pain. You have any of these signs of infection: More redness, swelling, or pain around your incision. More fluid or blood coming from your incision. Warmth coming from your incision. Pus or a bad smell coming from your incision. You fall. Get help right away if: You have severe pain. You have trouble breathing. You have chest pain. You have redness, swelling, pain, or warmth in your calf or leg. Your incision breaks open after sutures or staples are removed. These symptoms may represent a serious problem that is an emergency. Do not wait to see if the symptoms will go away. Get medical help right away. Call your local  emergency services (911 in the [place] Do not drive yourself to the hospital. Summary After the procedure, it is common to have pain and swelling at the incision area, a small amount of blood or fluid coming from your incision, and stiffness. Follow instructions from your health care provider about how to take care of your incision. Use crutches, a walker, or a cane as told by your health care provider. This information is not intended to replace advice given to you by your health care provider. Make sure you discuss any questions you have with your health care provider. Document Revised: 07/26/2019 Document Reviewed: 07/26/2019 Elsevier Patient Education  [place] Anesthesia, Adult, Care After The following information offers guidance on how to care for yourself after your procedure. Your health care provider may also give you more specific instructions. If you have problems or questions, contact your health care provider. What can I expect after the procedure? After the procedure, it is common for people to: Have pain or discomfort at the IV site. Have nausea or vomiting. Have a sore throat or hoarseness. Have trouble concentrating. Feel cold or chills. Feel weak, sleepy, or tired (fatigue). Have soreness and body aches. These can affect parts of the body that were not involved in surgery. Follow these instructions at home: For the time period you were told by your health care provider:  Rest. Do not participate in activities where you could fall or become injured. Do not drive or use machinery. Do not drink alcohol. Do not take sleeping pills or medicines that cause drowsiness. Do not make important decisions or sign legal documents. Do not take care of children on your own. General instructions Drink enough fluid to keep your urine pale yellow. If you have sleep apnea, surgery and certain medicines can increase your risk for breathing problems. Follow instructions  from your health care provider about wearing your sleep device: Anytime you are sleeping, including during daytime naps. While taking prescription pain medicines, sleeping medicines, or medicines that make you drowsy. Return to your normal activities as told by your health care provider. Ask your health care provider what activities are safe for you. Take over-the-counter and prescription medicines only as told by your health care provider. Do not use any products that contain nicotine or tobacco. These products include cigarettes, chewing tobacco, and vaping devices, such as e-cigarettes. These can delay incision healing after surgery. If you need  help quitting, ask your health care provider. Contact a health care provider if: You have nausea or vomiting that does not get better with medicine. You vomit every time you eat or drink. You have pain that does not get better with medicine. You cannot urinate or have bloody urine. You develop a skin rash. You have a fever. Get help right away if: You have trouble breathing. You have chest pain. You vomit blood. These symptoms may be an emergency. Get help right away. Call 911. Do not wait to see if the symptoms will go away. Do not drive yourself to the hospital. Summary After the procedure, it is common to have a sore throat, hoarseness, nausea, vomiting, or to feel weak, sleepy, or fatigue. For the time period you were told by your health care provider, do not drive or use machinery. Get help right away if you have difficulty breathing, have chest pain, or vomit blood. These symptoms may be an emergency. This information is not intended to replace advice given to you by your health care provider. Make sure you discuss any questions you have with your health care provider. Document Revised: 05/03/2021 Document Reviewed: 05/03/2021 Elsevier Patient Education  [place]. How to Use Chlorhexidine Before Surgery Chlorhexidine gluconate  (CHG) is a germ-killing (antiseptic) solution that is used to clean the skin. It can get rid of the bacteria that normally live on the skin and can keep them away for about 24 hours. To clean your skin with CHG, you may be given: A CHG solution to use in the shower or as part of a sponge bath. A prepackaged cloth that contains CHG. Cleaning your skin with CHG may help lower the risk for infection: While you are staying in the intensive care unit of the hospital. If you have a vascular access, such as a central line, to provide short-term or long-term access to your veins. If you have a catheter to drain urine from your bladder. If you are on a ventilator. A ventilator is a machine that helps you breathe by moving air in and out of your lungs. After surgery. What are the risks? Risks of using CHG include: A skin reaction. Hearing loss, if CHG gets in your ears and you have a perforated eardrum. Eye injury, if CHG gets in your eyes and is not rinsed out. The CHG product catching fire. Make sure that you avoid smoking and flames after applying CHG to your skin. Do not use CHG: If you have a chlorhexidine allergy or have previously reacted to chlorhexidine. On babies younger than 8 months of age. How to use CHG solution Use CHG only as told by your health care provider, and follow the instructions on the label. Use the full amount of CHG as directed. Usually, this is one bottle. During a shower Follow these steps when using CHG solution during a shower (unless your health care provider gives you different instructions): Start the shower. Use your normal soap and shampoo to wash your face and hair. Turn off the shower or move out of the shower stream. Pour the CHG onto a clean washcloth. Do not use any type of brush or rough-edged sponge. Starting at your neck, lather your body down to your toes. Make sure you follow these instructions: If you will be having surgery, pay special attention to  the part of your body where you will be having surgery. Scrub this area for at least 1 minute. Do not use CHG on your head  or face. If the solution gets into your ears or eyes, rinse them well with water. Avoid your genital area. Avoid any areas of skin that have broken skin, cuts, or scrapes. Scrub your back and under your arms. Make sure to wash skin folds. Let the lather sit on your skin for 1-2 minutes or as long as told by your health care provider. Thoroughly rinse your entire body in the shower. Make sure that all body creases and crevices are rinsed well. Dry off with a clean towel. Do not put any substances on your body afterward--such as powder, lotion, or perfume--unless you are told to do so by your health care provider. Only use lotions that are recommended by the manufacturer. Put on clean clothes or pajamas. If it is the night before your surgery, sleep in clean sheets.  During a sponge bath Follow these steps when using CHG solution during a sponge bath (unless your health care provider gives you different instructions): Use your normal soap and shampoo to wash your face and hair. Pour the CHG onto a clean washcloth. Starting at your neck, lather your body down to your toes. Make sure you follow these instructions: If you will be having surgery, pay special attention to the part of your body where you will be having surgery. Scrub this area for at least 1 minute. Do not use CHG on your head or face. If the solution gets into your ears or eyes, rinse them well with water. Avoid your genital area. Avoid any areas of skin that have broken skin, cuts, or scrapes. Scrub your back and under your arms. Make sure to wash skin folds. Let the lather sit on your skin for 1-2 minutes or as long as told by your health care provider. Using a different clean, wet washcloth, thoroughly rinse your entire body. Make sure that all body creases and crevices are rinsed well. Dry off with a clean  towel. Do not put any substances on your body afterward--such as powder, lotion, or perfume--unless you are told to do so by your health care provider. Only use lotions that are recommended by the manufacturer. Put on clean clothes or pajamas. If it is the night before your surgery, sleep in clean sheets. How to use CHG prepackaged cloths Only use CHG cloths as told by your health care provider, and follow the instructions on the label. Use the CHG cloth on clean, dry skin. Do not use the CHG cloth on your head or face unless your health care provider tells you to. When washing with the CHG cloth: Avoid your genital area. Avoid any areas of skin that have broken skin, cuts, or scrapes. Before surgery Follow these steps when using a CHG cloth to clean before surgery (unless your health care provider gives you different instructions): Using the CHG cloth, vigorously scrub the part of your body where you will be having surgery. Scrub using a back-and-forth motion for 3 minutes. The area on your body should be completely wet with CHG when you are done scrubbing. Do not rinse. Discard the cloth and let the area air-dry. Do not put any substances on the area afterward, such as powder, lotion, or perfume. Put on clean clothes or pajamas. If it is the night before your surgery, sleep in clean sheets.  For general bathing Follow these steps when using CHG cloths for general bathing (unless your health care provider gives you different instructions). Use a separate CHG cloth for each area of your body.  Make sure you wash between any folds of skin and between your fingers and toes. Wash your body in the following order, switching to a new cloth after each step: The front of your neck, shoulders, and chest. Both of your arms, under your arms, and your hands. Your stomach and groin area, avoiding the genitals. Your right leg and foot. Your left leg and foot. The back of your neck, your back, and your  buttocks. Do not rinse. Discard the cloth and let the area air-dry. Do not put any substances on your body afterward--such as powder, lotion, or perfume--unless you are told to do so by your health care provider. Only use lotions that are recommended by the manufacturer. Put on clean clothes or pajamas. Contact a health care provider if: Your skin gets irritated after scrubbing. You have questions about using your solution or cloth. You swallow any chlorhexidine. Call your local poison control center (1-423-450-7141 in the [place] Get help right away if: Your eyes itch badly, or they become very red or swollen. Your skin itches badly and is red or swollen. Your hearing changes. You have trouble seeing. You have swelling or tingling in your mouth or throat. You have trouble breathing. These symptoms may represent a serious problem that is an emergency. Do not wait to see if the symptoms will go away. Get medical help right away. Call your local emergency services (911 in the [place] Do not drive yourself to the hospital. Summary Chlorhexidine gluconate (CHG) is a germ-killing (antiseptic) solution that is used to clean the skin. Cleaning your skin with CHG may help to lower your risk for infection. You may be given CHG to use for bathing. It may be in a bottle or in a prepackaged cloth to use on your skin. Carefully follow your health care provider's instructions and the instructions on the product label. Do not use CHG if you have a chlorhexidine allergy. Contact your health care provider if your skin gets irritated after scrubbing. This information is not intended to replace advice given to you by your health care provider. Make sure you discuss any questions you have with your health care provider. Document Revised: 06/03/2021 Document Reviewed: 04/16/2020 Elsevier Patient Education  [place].

## 2022-05-02 ENCOUNTER — Encounter (HOSPITAL_COMMUNITY): Payer: Self-pay

## 2022-05-02 ENCOUNTER — Encounter (HOSPITAL_COMMUNITY)
Admission: RE | Admit: 2022-05-02 | Discharge: 2022-05-02 | Disposition: A | Discharge status: Home / Self Care | Payer: Medicaid Other | Source: Ambulatory Visit | Attending: Orthopedic Surgery | Admitting: Orthopedic Surgery

## 2022-05-02 VITALS — BP 133/71 | HR 74 | Temp 97.8°F | Resp 18 | Ht 69.0 in | Wt 253.1 lb

## 2022-05-02 DIAGNOSIS — E0800 Diabetes mellitus due to underlying condition with hyperosmolarity without nonketotic hyperglycemic-hyperosmolar coma (NKHHC): Secondary | ICD-10-CM | POA: Insufficient documentation

## 2022-05-02 DIAGNOSIS — Z01818 Encounter for other preprocedural examination: Secondary | ICD-10-CM | POA: Diagnosis not present

## 2022-05-02 DIAGNOSIS — M1711 Unilateral primary osteoarthritis, right knee: Secondary | ICD-10-CM | POA: Diagnosis not present

## 2022-05-02 DIAGNOSIS — I10 Essential (primary) hypertension: Secondary | ICD-10-CM

## 2022-05-02 HISTORY — DX: Prediabetes: R73.03

## 2022-05-02 HISTORY — DX: Other specified postprocedural states: Z98.890

## 2022-05-02 LAB — CBC WITH DIFFERENTIAL/PLATELET
Abs Immature Granulocytes: 0.02 10*3/uL (ref 0.00–0.07)
Basophils Absolute: 0 10*3/uL (ref 0.0–0.1)
Basophils Relative: 1 %
Eosinophils Absolute: 0.2 10*3/uL (ref 0.0–0.5)
Eosinophils Relative: 2 %
HCT: 38.9 % (ref 36.0–46.0)
Hemoglobin: 12.2 g/dL (ref 12.0–15.0)
Immature Granulocytes: 0 %
Lymphocytes Relative: 17 %
Lymphs Abs: 1.5 10*3/uL (ref 0.7–4.0)
MCH: 26.9 pg (ref 26.0–34.0)
MCHC: 31.4 g/dL (ref 30.0–36.0)
MCV: 85.7 fL (ref 80.0–100.0)
Monocytes Absolute: 0.5 10*3/uL (ref 0.1–1.0)
Monocytes Relative: 6 %
Neutro Abs: 6.6 10*3/uL (ref 1.7–7.7)
Neutrophils Relative %: 74 %
Platelets: 306 10*3/uL (ref 150–400)
RBC: 4.54 MIL/uL (ref 3.87–5.11)
RDW: 14.3 % (ref 11.5–15.5)
WBC: 8.9 10*3/uL (ref 4.0–10.5)
nRBC: 0 % (ref 0.0–0.2)

## 2022-05-02 LAB — BASIC METABOLIC PANEL
Anion gap: 10 (ref 5–15)
BUN: 18 mg/dL (ref 6–20)
CO2: 26 mmol/L (ref 22–32)
Calcium: 9 mg/dL (ref 8.9–10.3)
Chloride: 100 mmol/L (ref 98–111)
Creatinine, Ser: 0.84 mg/dL (ref 0.44–1.00)
GFR, Estimated: >60 mL/min (ref >60)
Glucose, Bld: 92 mg/dL (ref 70–99)
Potassium: 3.6 mmol/L (ref 3.5–5.1)
Sodium: 136 mmol/L (ref 135–145)

## 2022-05-02 LAB — SURGICAL PCR SCREEN
MRSA, PCR: NEGATIVE
Staphylococcus aureus: NEGATIVE

## 2022-05-02 LAB — PREPARE RBC (CROSSMATCH)

## 2022-05-03 LAB — HEMOGLOBIN A1C
Hgb A1c MFr Bld: 5.9 % — ABNORMAL HIGH (ref 4.8–5.6)
Mean Plasma Glucose: 123 mg/dL

## 2022-05-05 ENCOUNTER — Telehealth: Payer: Self-pay | Admitting: Orthopedic Surgery

## 2022-05-05 NOTE — Telephone Encounter (Signed)
HHPT out of network with her [facility] I will call her to discuss

## 2022-05-05 NOTE — Telephone Encounter (Signed)
I called her and asked her to call number on her Medicaid card and let me know who she can have come in for home PT she states she will let me know.

## 2022-05-07 NOTE — Progress Notes (Signed)
Discussed with Dr. Aline Brochure regarding right adductor canal nerve block for post op pain. Dr. Aline Brochure wants block to be done before 7.15 am.

## 2022-05-09 ENCOUNTER — Ambulatory Visit (HOSPITAL_COMMUNITY)
Admission: RE | Admit: 2022-05-09 | Discharge: 2022-05-10 | Disposition: A | Discharge status: Home / Self Care | Payer: Medicaid Other | Source: Ambulatory Visit | Attending: Orthopedic Surgery | Admitting: Orthopedic Surgery

## 2022-05-09 ENCOUNTER — Encounter (HOSPITAL_COMMUNITY)
Admission: RE | Disposition: A | Discharge status: Home / Self Care | Payer: Self-pay | Source: Ambulatory Visit | Attending: Orthopedic Surgery

## 2022-05-09 ENCOUNTER — Ambulatory Visit (HOSPITAL_COMMUNITY): Payer: Medicaid Other | Admitting: Anesthesiology

## 2022-05-09 ENCOUNTER — Other Ambulatory Visit: Payer: Self-pay

## 2022-05-09 ENCOUNTER — Ambulatory Visit (HOSPITAL_COMMUNITY): Payer: Medicaid Other

## 2022-05-09 DIAGNOSIS — M1711 Unilateral primary osteoarthritis, right knee: Secondary | ICD-10-CM | POA: Diagnosis present

## 2022-05-09 DIAGNOSIS — R7303 Prediabetes: Secondary | ICD-10-CM | POA: Insufficient documentation

## 2022-05-09 DIAGNOSIS — Z8701 Personal history of pneumonia (recurrent): Secondary | ICD-10-CM | POA: Diagnosis not present

## 2022-05-09 DIAGNOSIS — F419 Anxiety disorder, unspecified: Secondary | ICD-10-CM | POA: Diagnosis not present

## 2022-05-09 DIAGNOSIS — K219 Gastro-esophageal reflux disease without esophagitis: Secondary | ICD-10-CM | POA: Insufficient documentation

## 2022-05-09 DIAGNOSIS — F32A Depression, unspecified: Secondary | ICD-10-CM | POA: Insufficient documentation

## 2022-05-09 DIAGNOSIS — I10 Essential (primary) hypertension: Secondary | ICD-10-CM | POA: Insufficient documentation

## 2022-05-09 DIAGNOSIS — Z87891 Personal history of nicotine dependence: Secondary | ICD-10-CM | POA: Diagnosis not present

## 2022-05-09 HISTORY — PX: TOTAL KNEE ARTHROPLASTY: SHX125

## 2022-05-09 LAB — GLUCOSE, CAPILLARY
Glucose-Capillary: 100 mg/dL — ABNORMAL HIGH (ref 70–99)
Glucose-Capillary: 105 mg/dL — ABNORMAL HIGH (ref 70–99)

## 2022-05-09 LAB — ABO/RH: ABO/RH(D): A POS

## 2022-05-09 SURGERY — ARTHROPLASTY, KNEE, TOTAL
Anesthesia: Spinal | Site: Knee | Laterality: Right

## 2022-05-09 MED ORDER — EPHEDRINE SULFATE-NACL 50-0.9 MG/10ML-% IV SOSY
PREFILLED_SYRINGE | INTRAVENOUS | Status: DC | PRN
  Administered 2022-05-09: 10 mg via INTRAVENOUS
  Administered 2022-05-09: 5 mg via INTRAVENOUS
  Administered 2022-05-09: 10 mg via INTRAVENOUS

## 2022-05-09 MED ORDER — NAPROXEN 250 MG PO TABS
250.0000 mg | ORAL_TABLET | Freq: Two times a day (BID) | ORAL | Status: DC
  Administered 2022-05-09 – 2022-05-10 (×2): 250 mg via ORAL
  Filled 2022-05-09 (×2): qty 1

## 2022-05-09 MED ORDER — PROPOFOL 500 MG/50ML IV EMUL
INTRAVENOUS | Status: AC
  Filled 2022-05-09: qty 50

## 2022-05-09 MED ORDER — TRIAMCINOLONE ACETONIDE 0.1 % EX CREA: 1.0000 | TOPICAL_CREAM | Freq: Two times a day (BID) | CUTANEOUS | Status: DC | PRN

## 2022-05-09 MED ORDER — LIDOCAINE HCL (PF) 2 % IJ SOLN
INTRAMUSCULAR | Status: AC
  Filled 2022-05-09: qty 5

## 2022-05-09 MED ORDER — HYDROMORPHONE HCL 1 MG/ML IJ SOLN
INTRAMUSCULAR | Status: AC
  Filled 2022-05-09: qty 0.5

## 2022-05-09 MED ORDER — LACTATED RINGERS IV SOLN: INTRAVENOUS | Status: DC

## 2022-05-09 MED ORDER — PREGABALIN 50 MG PO CAPS
50.0000 mg | ORAL_CAPSULE | Freq: Once | ORAL | Status: AC
  Administered 2022-05-09: 50 mg via ORAL
  Filled 2022-05-09: qty 1

## 2022-05-09 MED ORDER — METOCLOPRAMIDE HCL 5 MG PO TABS: 5.0000 mg | ORAL_TABLET | Freq: Three times a day (TID) | ORAL | Status: DC | PRN

## 2022-05-09 MED ORDER — PROPOFOL 10 MG/ML IV BOLUS
INTRAVENOUS | Status: DC | PRN
  Administered 2022-05-09: 30 ug/kg/min via INTRAVENOUS

## 2022-05-09 MED ORDER — MEPERIDINE HCL 50 MG/ML IJ SOLN: 6.2500 mg | INTRAMUSCULAR | Status: DC | PRN

## 2022-05-09 MED ORDER — HYDROCHLOROTHIAZIDE 25 MG PO TABS
25.0000 mg | ORAL_TABLET | Freq: Every day | ORAL | Status: DC
  Administered 2022-05-09 – 2022-05-10 (×2): 25 mg via ORAL
  Filled 2022-05-09 (×2): qty 1

## 2022-05-09 MED ORDER — PHENYLEPHRINE HCL-NACL 20-0.9 MG/250ML-% IV SOLN
INTRAVENOUS | Status: AC
  Filled 2022-05-09: qty 250

## 2022-05-09 MED ORDER — ESCITALOPRAM OXALATE 10 MG PO TABS
10.0000 mg | ORAL_TABLET | Freq: Every day | ORAL | Status: DC
  Administered 2022-05-09: 10 mg via ORAL
  Filled 2022-05-09 (×3): qty 1

## 2022-05-09 MED ORDER — TRAMADOL HCL 50 MG PO TABS
50.0000 mg | ORAL_TABLET | Freq: Four times a day (QID) | ORAL | Status: DC
  Administered 2022-05-09 – 2022-05-10 (×5): 50 mg via ORAL
  Filled 2022-05-09 (×5): qty 1

## 2022-05-09 MED ORDER — HYDROCODONE-ACETAMINOPHEN 7.5-325 MG PO TABS: 1.0000 | ORAL_TABLET | ORAL | Status: DC | PRN

## 2022-05-09 MED ORDER — BUPIVACAINE-MELOXICAM ER 200-6 MG/7ML IJ SOLN
INTRAMUSCULAR | Status: DC | PRN
  Administered 2022-05-09: 400 mg

## 2022-05-09 MED ORDER — ASPIRIN 325 MG PO TBEC
325.0000 mg | DELAYED_RELEASE_TABLET | Freq: Every day | ORAL | Status: DC
  Administered 2022-05-10: 325 mg via ORAL
  Filled 2022-05-09: qty 1

## 2022-05-09 MED ORDER — SODIUM CHLORIDE 0.9 % IV SOLN: INTRAVENOUS | Status: DC

## 2022-05-09 MED ORDER — CEFAZOLIN SODIUM-DEXTROSE 2-4 GM/100ML-% IV SOLN
2.0000 g | Freq: Four times a day (QID) | INTRAVENOUS | Status: AC
  Administered 2022-05-09 (×2): 2 g via INTRAVENOUS
  Filled 2022-05-09 (×2): qty 100

## 2022-05-09 MED ORDER — HYDROCODONE-ACETAMINOPHEN 5-325 MG PO TABS: 1.0000 | ORAL_TABLET | ORAL | Status: DC | PRN

## 2022-05-09 MED ORDER — DOCUSATE SODIUM 100 MG PO CAPS
100.0000 mg | ORAL_CAPSULE | Freq: Two times a day (BID) | ORAL | Status: DC
  Administered 2022-05-09 – 2022-05-10 (×2): 100 mg via ORAL
  Filled 2022-05-09 (×2): qty 1

## 2022-05-09 MED ORDER — ONDANSETRON HCL 4 MG/2ML IJ SOLN: 4.0000 mg | Freq: Once | INTRAMUSCULAR | Status: DC | PRN

## 2022-05-09 MED ORDER — ONDANSETRON HCL 4 MG/2ML IJ SOLN
INTRAMUSCULAR | Status: DC | PRN
  Administered 2022-05-09: 4 mg via INTRAVENOUS

## 2022-05-09 MED ORDER — ROPIVACAINE HCL 5 MG/ML IJ SOLN
INTRAMUSCULAR | Status: AC
  Filled 2022-05-09: qty 30

## 2022-05-09 MED ORDER — BUPIVACAINE-MELOXICAM ER 200-6 MG/7ML IJ SOLN
INTRAMUSCULAR | Status: AC
  Filled 2022-05-09: qty 2

## 2022-05-09 MED ORDER — 0.9 % SODIUM CHLORIDE (POUR BTL) OPTIME
TOPICAL | Status: DC | PRN
  Administered 2022-05-09: 1000 mL

## 2022-05-09 MED ORDER — BUPIVACAINE IN DEXTROSE 0.75-8.25 % IT SOLN
INTRATHECAL | Status: DC | PRN
  Administered 2022-05-09: 1.8 mL via INTRATHECAL

## 2022-05-09 MED ORDER — METHOCARBAMOL 500 MG PO TABS
500.0000 mg | ORAL_TABLET | Freq: Four times a day (QID) | ORAL | Status: DC | PRN
  Administered 2022-05-10: 500 mg via ORAL
  Filled 2022-05-09: qty 1

## 2022-05-09 MED ORDER — FENTANYL CITRATE (PF) 100 MCG/2ML IJ SOLN: 100.0000 ug | Freq: Once | INTRAMUSCULAR | Status: DC

## 2022-05-09 MED ORDER — TRANEXAMIC ACID-NACL 1000-0.7 MG/100ML-% IV SOLN
1000.0000 mg | Freq: Once | INTRAVENOUS | Status: AC
  Administered 2022-05-09: 1000 mg via INTRAVENOUS
  Filled 2022-05-09: qty 100

## 2022-05-09 MED ORDER — POLYETHYLENE GLYCOL 3350 17 G PO PACK: 17.0000 g | PACK | Freq: Every day | ORAL | Status: DC | PRN

## 2022-05-09 MED ORDER — HYDROMORPHONE HCL 1 MG/ML IJ SOLN
0.2500 mg | INTRAMUSCULAR | Status: AC | PRN
  Administered 2022-05-09 (×4): 0.5 mg via INTRAVENOUS
  Filled 2022-05-09 (×2): qty 0.5

## 2022-05-09 MED ORDER — OXYCODONE HCL 5 MG PO TABS
5.0000 mg | ORAL_TABLET | Freq: Once | ORAL | Status: AC
  Administered 2022-05-09: 5 mg via ORAL
  Filled 2022-05-09: qty 1

## 2022-05-09 MED ORDER — MIDAZOLAM HCL 2 MG/2ML IJ SOLN
INTRAMUSCULAR | Status: AC
  Administered 2022-05-09: 2 mg
  Filled 2022-05-09: qty 2

## 2022-05-09 MED ORDER — FENTANYL CITRATE PF 50 MCG/ML IJ SOSY
PREFILLED_SYRINGE | INTRAMUSCULAR | Status: AC
  Administered 2022-05-09: 50 ug
  Filled 2022-05-09: qty 1

## 2022-05-09 MED ORDER — DEXAMETHASONE SODIUM PHOSPHATE 4 MG/ML IJ SOLN
INTRAMUSCULAR | Status: AC
  Filled 2022-05-09: qty 2

## 2022-05-09 MED ORDER — ONDANSETRON HCL 4 MG/2ML IJ SOLN
INTRAMUSCULAR | Status: DC | PRN
  Administered 2022-05-09: 4 mg via INTRAMUSCULAR

## 2022-05-09 MED ORDER — PHENYLEPHRINE HCL-NACL 20-0.9 MG/250ML-% IV SOLN
INTRAVENOUS | Status: DC | PRN
  Administered 2022-05-09: 30 ug/min via INTRAVENOUS

## 2022-05-09 MED ORDER — PROPOFOL 10 MG/ML IV BOLUS
INTRAVENOUS | Status: AC
  Filled 2022-05-09: qty 20

## 2022-05-09 MED ORDER — MIDAZOLAM HCL 2 MG/2ML IJ SOLN
INTRAMUSCULAR | Status: AC
  Filled 2022-05-09: qty 2

## 2022-05-09 MED ORDER — DEXAMETHASONE SODIUM PHOSPHATE 10 MG/ML IJ SOLN
INTRAMUSCULAR | Status: DC | PRN
  Administered 2022-05-09: 10 mg via INTRAVENOUS

## 2022-05-09 MED ORDER — EPHEDRINE 5 MG/ML INJ
INTRAVENOUS | Status: AC
  Filled 2022-05-09: qty 5

## 2022-05-09 MED ORDER — LIDOCAINE HCL (PF) 1 % IJ SOLN
INTRAMUSCULAR | Status: DC | PRN
  Administered 2022-05-09: 5 mL

## 2022-05-09 MED ORDER — PHENOL 1.4 % MT LIQD: 1.0000 | OROMUCOSAL | Status: DC | PRN

## 2022-05-09 MED ORDER — LIDOCAINE HCL (PF) 1 % IJ SOLN
INTRAMUSCULAR | Status: AC
  Filled 2022-05-09: qty 30

## 2022-05-09 MED ORDER — GLYCOPYRROLATE PF 0.2 MG/ML IJ SOSY
PREFILLED_SYRINGE | INTRAMUSCULAR | Status: DC | PRN
  Administered 2022-05-09: .2 mg via INTRAVENOUS

## 2022-05-09 MED ORDER — ACETAMINOPHEN 325 MG PO TABS: 325.0000 mg | ORAL_TABLET | Freq: Four times a day (QID) | ORAL | Status: DC | PRN

## 2022-05-09 MED ORDER — ONDANSETRON HCL 4 MG/2ML IJ SOLN
4.0000 mg | Freq: Once | INTRAMUSCULAR | Status: AC
  Administered 2022-05-09: 4 mg via INTRAVENOUS
  Filled 2022-05-09: qty 2

## 2022-05-09 MED ORDER — ONDANSETRON HCL 4 MG/2ML IJ SOLN
INTRAMUSCULAR | Status: AC
  Filled 2022-05-09: qty 2

## 2022-05-09 MED ORDER — DEXAMETHASONE SODIUM PHOSPHATE 10 MG/ML IJ SOLN
10.0000 mg | Freq: Once | INTRAMUSCULAR | Status: AC
  Administered 2022-05-10: 10 mg via INTRAVENOUS
  Filled 2022-05-09: qty 1

## 2022-05-09 MED ORDER — SURGIRINSE WOUND IRRIGATION SYSTEM - OPTIME
TOPICAL | Status: DC | PRN
  Administered 2022-05-09: 450 mL via TOPICAL

## 2022-05-09 MED ORDER — ORAL CARE MOUTH RINSE: 15.0000 mL | Freq: Once | OROMUCOSAL | Status: AC

## 2022-05-09 MED ORDER — ACETAMINOPHEN 500 MG PO TABS
500.0000 mg | ORAL_TABLET | Freq: Four times a day (QID) | ORAL | Status: AC
  Administered 2022-05-09 – 2022-05-10 (×4): 500 mg via ORAL
  Filled 2022-05-09 (×4): qty 1

## 2022-05-09 MED ORDER — METHOCARBAMOL 1000 MG/10ML IJ SOLN
500.0000 mg | Freq: Once | INTRAVENOUS | Status: AC
  Administered 2022-05-09: 500 mg via INTRAVENOUS
  Filled 2022-05-09: qty 5

## 2022-05-09 MED ORDER — SCOPOLAMINE 1 MG/3DAYS TD PT72
MEDICATED_PATCH | TRANSDERMAL | Status: AC
  Filled 2022-05-09: qty 1

## 2022-05-09 MED ORDER — SODIUM CHLORIDE 0.9 % IR SOLN
Status: DC | PRN
  Administered 2022-05-09: 3000 mL

## 2022-05-09 MED ORDER — ONDANSETRON HCL 4 MG/2ML IJ SOLN: 4.0000 mg | Freq: Four times a day (QID) | INTRAMUSCULAR | Status: DC | PRN

## 2022-05-09 MED ORDER — MIDAZOLAM HCL 2 MG/2ML IJ SOLN
2.0000 mg | Freq: Once | INTRAMUSCULAR | Status: AC
  Administered 2022-05-09: 2 mg via INTRAVENOUS

## 2022-05-09 MED ORDER — IBUPROFEN 400 MG PO TABS
400.0000 mg | ORAL_TABLET | Freq: Once | ORAL | Status: AC
  Administered 2022-05-09: 400 mg via ORAL
  Filled 2022-05-09: qty 1

## 2022-05-09 MED ORDER — DEXAMETHASONE SODIUM PHOSPHATE 10 MG/ML IJ SOLN
INTRAMUSCULAR | Status: AC
  Filled 2022-05-09: qty 1

## 2022-05-09 MED ORDER — PHENYLEPHRINE 80 MCG/ML (10ML) SYRINGE FOR IV PUSH (FOR BLOOD PRESSURE SUPPORT)
PREFILLED_SYRINGE | INTRAVENOUS | Status: AC
  Filled 2022-05-09: qty 10

## 2022-05-09 MED ORDER — METOCLOPRAMIDE HCL 5 MG/ML IJ SOLN: 5.0000 mg | Freq: Three times a day (TID) | INTRAMUSCULAR | Status: DC | PRN

## 2022-05-09 MED ORDER — PANTOPRAZOLE SODIUM 40 MG PO TBEC
40.0000 mg | DELAYED_RELEASE_TABLET | Freq: Every day | ORAL | Status: DC
  Administered 2022-05-10: 40 mg via ORAL
  Filled 2022-05-09: qty 1

## 2022-05-09 MED ORDER — ROPIVACAINE HCL 5 MG/ML IJ SOLN
INTRAMUSCULAR | Status: DC | PRN
  Administered 2022-05-09: 28 mL via PERINEURAL

## 2022-05-09 MED ORDER — MENTHOL 3 MG MT LOZG: 1.0000 | LOZENGE | OROMUCOSAL | Status: DC | PRN

## 2022-05-09 MED ORDER — METHOCARBAMOL 1000 MG/10ML IJ SOLN: 500.0000 mg | Freq: Four times a day (QID) | INTRAVENOUS | Status: DC | PRN

## 2022-05-09 MED ORDER — TRANEXAMIC ACID-NACL 1000-0.7 MG/100ML-% IV SOLN
1000.0000 mg | INTRAVENOUS | Status: AC
  Administered 2022-05-09: 1000 mg via INTRAVENOUS
  Filled 2022-05-09: qty 100

## 2022-05-09 MED ORDER — MORPHINE SULFATE (PF) 2 MG/ML IV SOLN: 0.5000 mg | INTRAVENOUS | Status: DC | PRN

## 2022-05-09 MED ORDER — ONDANSETRON HCL 4 MG PO TABS: 4.0000 mg | ORAL_TABLET | Freq: Four times a day (QID) | ORAL | Status: DC | PRN

## 2022-05-09 MED ORDER — VANCOMYCIN HCL 1500 MG/300ML IV SOLN
1500.0000 mg | INTRAVENOUS | Status: AC
  Administered 2022-05-09: 1500 mg via INTRAVENOUS
  Filled 2022-05-09 (×2): qty 300

## 2022-05-09 MED ORDER — CHLORHEXIDINE GLUCONATE 0.12 % MT SOLN
15.0000 mL | Freq: Once | OROMUCOSAL | Status: AC
  Administered 2022-05-09: 15 mL via OROMUCOSAL

## 2022-05-09 MED ORDER — SCOPOLAMINE 1 MG/3DAYS TD PT72
1.0000 | MEDICATED_PATCH | Freq: Once | TRANSDERMAL | Status: DC
  Administered 2022-05-09: 1.5 mg via TRANSDERMAL

## 2022-05-09 MED ORDER — DEXAMETHASONE SODIUM PHOSPHATE 4 MG/ML IJ SOLN
INTRAMUSCULAR | Status: DC | PRN
  Administered 2022-05-09: 8 mg via PERINEURAL

## 2022-05-09 MED ORDER — FENTANYL CITRATE (PF) 100 MCG/2ML IJ SOLN
INTRAMUSCULAR | Status: AC
  Filled 2022-05-09: qty 2

## 2022-05-09 SURGICAL SUPPLY — 65 items
ATTUNE PSFEM RTSZ6 NARCEM KNEE (Femur) IMPLANT
BANDAGE ESMARK 6X9 LF (GAUZE/BANDAGES/DRESSINGS) ×2 IMPLANT
BASEPLATE TIB CMT FB PCKT SZ5 (Knees) IMPLANT
BLADE SAGITTAL 25.0X1.27X90 (BLADE) ×2 IMPLANT
BLADE SAW SGTL 11.0X1.19X90.0M (BLADE) ×2 IMPLANT
BLADE SURG SZ10 CARB STEEL (BLADE) ×2 IMPLANT
BNDG CMPR 9X6 STRL LF SNTH (GAUZE/BANDAGES/DRESSINGS) ×1
BNDG ESMARK 6X9 LF (GAUZE/BANDAGES/DRESSINGS) ×1
BSPLAT TIB 5 CMNT FXBRNG STRL (Knees) ×1 IMPLANT
CEMENT HV SMART SET (Cement) ×4 IMPLANT
CLOTH BEACON ORANGE TIMEOUT ST (SAFETY) ×2 IMPLANT
COOLER ICEMAN CLASSIC (MISCELLANEOUS) ×2 IMPLANT
COVER LIGHT HANDLE STERIS (MISCELLANEOUS) ×4 IMPLANT
CUFF TOURN SGL QUICK 34 (TOURNIQUET CUFF) ×1
CUFF TRNQT CYL 34X4.125X (TOURNIQUET CUFF) ×2 IMPLANT
DRAPE BACK TABLE (DRAPES) ×2 IMPLANT
DRAPE EXTREMITY T 121X128X90 (DISPOSABLE) ×2 IMPLANT
DRESSING AQUACEL AG ADV 3.5X12 (MISCELLANEOUS) ×2 IMPLANT
DRSG AQUACEL AG ADV 3.5X10 (GAUZE/BANDAGES/DRESSINGS) IMPLANT
DRSG AQUACEL AG ADV 3.5X12 (MISCELLANEOUS) ×1
DURAPREP 26ML APPLICATOR (WOUND CARE) ×4 IMPLANT
ELECT REM PT RETURN 9FT ADLT (ELECTROSURGICAL) ×1
ELECTRODE REM PT RTRN 9FT ADLT (ELECTROSURGICAL) ×2 IMPLANT
GLOVE BIOGEL PI IND STRL 7.0 (GLOVE) ×6 IMPLANT
GLOVE BIOGEL PI IND STRL 8.5 (GLOVE) ×2 IMPLANT
GLOVE SKINSENSE STRL SZ8.0 LF (GLOVE) ×2 IMPLANT
GLOVE SURG SS PI 7.0 STRL IVOR (GLOVE) IMPLANT
GOWN STRL REUS W/TWL LRG LVL3 (GOWN DISPOSABLE) ×6 IMPLANT
GOWN STRL REUS W/TWL XL LVL3 (GOWN DISPOSABLE) ×2 IMPLANT
HANDPIECE INTERPULSE COAX TIP (DISPOSABLE) ×1
HOOD W/PEELAWAY (MISCELLANEOUS) ×8 IMPLANT
INSERT TIB PS FB ATTUNE SZ6X5 (Knees) IMPLANT
INST SET MAJOR BONE (KITS) ×2 IMPLANT
IV NS IRRIG 3000ML ARTHROMATIC (IV SOLUTION) ×2 IMPLANT
KIT BLADEGUARD II DBL (SET/KITS/TRAYS/PACK) ×2 IMPLANT
KIT TURNOVER KIT A (KITS) ×2 IMPLANT
MANIFOLD NEPTUNE II (INSTRUMENTS) ×2 IMPLANT
MARKER SKIN DUAL TIP RULER LAB (MISCELLANEOUS) ×2 IMPLANT
NDL HYPO 21X1.5 SAFETY (NEEDLE) IMPLANT
NEEDLE HYPO 21X1.5 SAFETY (NEEDLE) IMPLANT
NS IRRIG 1000ML POUR BTL (IV SOLUTION) ×2 IMPLANT
PACK TOTAL JOINT (CUSTOM PROCEDURE TRAY) ×2 IMPLANT
PAD ARMBOARD 7.5X6 YLW CONV (MISCELLANEOUS) ×2 IMPLANT
PAD ORTHO SHOULDER 7X19 LRG (SOFTGOODS) IMPLANT
PATELLA MEDIAL ATTUN 35MM KNEE (Knees) IMPLANT
PILLOW KNEE EXTENSION 0 DEG (MISCELLANEOUS) ×2 IMPLANT
PIN/DRILL PACK ORTHO 1/8X3.0 (PIN) ×2 IMPLANT
SAW OSC TIP CART 19.5X105X1.3 (SAW) ×2 IMPLANT
SET BASIN LINEN APH (SET/KITS/TRAYS/PACK) ×2 IMPLANT
SET HNDPC FAN SPRY TIP SCT (DISPOSABLE) ×2 IMPLANT
SOLUTION IRRIG SURGIPHOR (IV SOLUTION) IMPLANT
SPONGE T-LAP 18X18 ~~LOC~~+RFID (SPONGE) IMPLANT
STAPLER VISISTAT 35W (STAPLE) ×2 IMPLANT
SUT BRALON NAB BRD #1 30IN (SUTURE) ×2 IMPLANT
SUT MNCRL 0 VIOLET CTX 36 (SUTURE) ×2 IMPLANT
SUT MON AB 0 CT1 (SUTURE) ×2 IMPLANT
SUT MONOCRYL 0 CTX 36 (SUTURE) ×1
SYR 20ML LL LF (SYRINGE) IMPLANT
SYR BULB IRRIG 60ML STRL (SYRINGE) ×2 IMPLANT
TOWEL OR 17X26 4PK STRL BLUE (TOWEL DISPOSABLE) ×2 IMPLANT
TOWER CARTRIDGE SMART MIX (DISPOSABLE) ×2 IMPLANT
TRAY FOL W/BAG SLVR 16FR STRL (SET/KITS/TRAYS/PACK) IMPLANT
TRAY FOLEY W/BAG SLVR 16FR LF (SET/KITS/TRAYS/PACK) ×1
WATER STERILE IRR 1000ML POUR (IV SOLUTION) ×4 IMPLANT
YANKAUER SUCT 12FT TUBE ARGYLE (SUCTIONS) ×2 IMPLANT

## 2022-05-09 NOTE — Op Note (Signed)
05/09/2022  9:59 AM  PATIENT:  Taylor Ingram  57 y.o. female  PRE-OPERATIVE DIAGNOSIS:  Right Knee Osteoarthritis  POST-OPERATIVE DIAGNOSIS:  Right Knee Osteoarthritis  PROCEDURE:  Procedure(s): TOTAL KNEE ARTHROPLASTY (Right)  SURGEON:  Surgeon(s) and Role:    Carole Civil, MD - Primary  PHYSICIAN ASSISTANT:   ASSISTANTS: Fulton Mole and Delene Ruffini  ANESTHESIA:   spinal and adductor canal block  EBL:  20cc   BLOOD ADMINISTERED:none  DRAINS: none   LOCAL MEDICATIONS USED:  OTHER zinrelef  SPECIMEN:  No Specimen  DISPOSITION OF SPECIMEN:  N/A  COUNTS:  YES  TOURNIQUET:   Total Tourniquet Time Documented: Thigh (Right) - 99 minutes Total: Thigh (Right) - 99 minutes   DICTATION: .Viviann Spare Dictation  PLAN OF CARE: Admit for overnight observation  PATIENT DISPOSITION:  PACU - hemodynamically stable.   Delay start of Pharmacological VTE agent (>24hrs) due to surgical blood loss or risk of bleeding: yes   Orthopaedic Surgery Operative Note (CSN: [id number])  Taylor Ingram  1965/08/03 Date of Surgery: 05/09/2022 Implants: Implant Name Type Inv. Item Serial No. Manufacturer Lot No. LRB No. Used Action  CEMENT HV SMART SET - [id number] Cement CEMENT HV SMART SET  [employer] [id number] Right 1 Implanted  CEMENT HV SMART SET - [id number] Cement CEMENT HV SMART SET  [employer] [id number] Right 1 Implanted  ATTUNE PSFEM RTSZ6 NARCEM KNEE - [id number] Femur ATTUNE PSFEM RTSZ6 NARCEM KNEE  [employer] [id number] Right 1 Implanted  BASEPLATE TIB CMT FB PCKT SZ5 - [id number] Knees BASEPLATE TIB CMT FB PCKT SZ5  [employer] [id number] Right 1 Implanted  PATELLA MEDIAL ATTUN 35MM KNEE - [id number] Knees PATELLA MEDIAL ATTUN 35MM KNEE  [employer]PU[employer]TH[employer] [id number] Right 1 Implanted  INSERT TIB PS FB ATTUNE SZ6X5 - [id number] Knees INSERT TIB PS FB ATTUNE SZ6X5  [employer]PU[employer]TH[employer] [id number] Right 1 Implanted    Indications for Surgery:    Taylor Ingram is a 57 y.o. female who the pain after seeing orthopedist in [facility].  He recommended surgery but his OR was having infection issues and he could not do the surgery and sent her to me for possible surgical intervention.  Benefits and risks of operative and nonoperative management were discussed prior to surgery with patient/guardian(s) and informed consent form was completed.  Specific risks including infection, need for additional surgery, postop pain, stiffness, arthrofibrosis, DVT, PE, loosening.  After preop evaluation patient was cleared for surgery implants were were confirmed images were reviewed.  The patient was taken to the operating room where she had a spinal anesthetic after adductor canal block preoperatively.  Foley catheter was inserted.  The right leg was prepped and draped sterilely  Timeout was completed.  The limb was exsanguinated with a 6 inch Esmarch tourniquet was elevated to 300 mmHg.  A midline incision was made over the right knee subcutaneous tissue was divided down to the extensor mechanism.  Medial arthrotomy was performed the patella was everted the fat pad was excised.  The anterior horn of the medial meniscus was excised.  There was no anterior horn of the lateral meniscus there was an osteophyte anteriorly which was removed.  There was also a osteophyte at the tibial spine which was excised.  The ACL and PCL were exposed using a osteotome as the notch was stenotic.  The ACL and PCL were then excised.  The knee was subluxated forward.  The remaining portion of the menisci were removed.  At  the 3/8 inch bit was used to enter the femoral canal was decompressed with suction and irrigation and the fluted guide rod.  The distal femoral cut was set for 4 degrees, 9 mm resection for right knee.  Once the implant was pinned an angel wing was used to assess the lateral femoral condyle which was deficient.  The block was then moved 2 mm for an 11 mm  cut.  After this cut was made attention was turned to the tibia  The tibial external alignment guide was set for neutral cut with 3 degree slope.  The deficient lateral side was used for reference.  Was started with a 2 mm resection.  After this resection was completed a spacer block was placed in the knee with a 5 mm block.  This was too tight 2 additional millimeters of bone was covered from the tibia.  This was also tight with the spacer block and 2 additional millimeters of bone was cut from the distal femur.  The spacer block then fit in nicely with good extension and normal and stable collateral ligaments  I then measured the femur to a size 6 4 cuts were made with the 5 mm spacer block used to set the posterior femoral cut.  After 4 cuts were made the spacer block was checked and 5 mm spacer block fit nicely in the extension and flexion gaps.  The notch was cut for the femur trial reduction was performed the flexion arc was 0-1 15.  Stability test and flexion were used using the anterior drawer test and rotation test.  The tibia was punched  The patella was resected from 21 down to 10 a 35 button was used.  Drill holes were used through the 35 button resection guide  Trial reduction was performed with all components in place.  I was happy with the extension and flexion stability as well as patella tracking  The knee was then irrigated drill holes were placed in the proximal tibia to improve cement fixation.  After all implants were cemented in place excess cement was removed  Normal saline irrigation and Betadine solution irrigation was used as well.  The extensor mechanism was closed with #1 Braylon in interrupted fashion.  This in relief was placed into the joint and the rent in the extensor maximus was closed with another #1 Braylon subcutaneous tissue was closed with 0 Monocryl subcuticular stitch was also used with 0 Monocryl and staples were used to close the skin  Sterile dressing  was applied  Postop protocol full weightbearing as tolerated.  Aspirin for DVT prophylaxis physical therapy can start this afternoon

## 2022-05-09 NOTE — Interval H&P Note (Signed)
History and Physical Interval Note:  05/09/2022 7:04 AM  Taylor Ingram  has presented today for surgery, with the diagnosis of right total knee arthroplasty.  The various methods of treatment have been discussed with the patient and family. After consideration of risks, benefits and other options for treatment, the patient has consented to  Procedure(s): TOTAL KNEE ARTHROPLASTY (Right) as a surgical intervention.  The patient's history has been reviewed, patient examined, no change in status, stable for surgery.  I have reviewed the patient's chart and labs.  Questions were answered to the patient's satisfaction.     Arther Abbott

## 2022-05-09 NOTE — Anesthesia Postprocedure Evaluation (Signed)
Anesthesia Post Note  Patient: Taylor Ingram  Procedure(s) Performed: TOTAL KNEE ARTHROPLASTY (Right: Knee)  Patient location during evaluation: PACU Anesthesia Type: Spinal Level of consciousness: awake and alert and oriented Pain management: pain level controlled Vital Signs Assessment: post-procedure vital signs reviewed and stable Respiratory status: spontaneous breathing, nonlabored ventilation, respiratory function stable and patient connected to nasal cannula oxygen Cardiovascular status: blood pressure returned to baseline and stable Postop Assessment: no apparent nausea or vomiting Anesthetic complications: no  No notable events documented.   Last Vitals:  Vitals:   05/09/22 1230 05/09/22 1302  BP: 126/62 (!) 142/72  Pulse: 91 85  Resp: 18 17  Temp:  36.6 C  SpO2: 96% 93%    Last Pain:  Vitals:   05/09/22 1302  TempSrc: Oral  PainSc: 3                  Ayrabella Labombard C Robben Jagiello

## 2022-05-09 NOTE — Plan of Care (Signed)
  Problem: Acute Rehab PT Goals(only PT should resolve) Goal: Pt Will Go Supine/Side To Sit Outcome: Progressing Flowsheets (Taken 05/09/2022 1520) Pt will go Supine/Side to Sit:  with modified independence  Independently Goal: Patient Will Transfer Sit To/From Stand Outcome: Progressing Flowsheets (Taken 05/09/2022 1520) Patient will transfer sit to/from stand: with modified independence Goal: Pt Will Transfer Bed To Chair/Chair To Bed Outcome: Progressing Flowsheets (Taken 05/09/2022 1520) Pt will Transfer Bed to Chair/Chair to Bed: with modified independence Goal: Pt Will Ambulate Outcome: Progressing Flowsheets (Taken 05/09/2022 1520) Pt will Ambulate:  > 125 feet  with modified independence  with rolling walker   3:20 PM, 05/09/22 Lonell Grandchild, MPT Physical Therapist with [facility]: [address] [phone] office [phone] mobile phone

## 2022-05-09 NOTE — Anesthesia Procedure Notes (Signed)
Anesthesia Regional Block: Adductor canal block   Pre-Anesthetic Checklist: , timeout performed,  Correct Patient, Correct Site, Correct Laterality,  Correct Procedure, Correct Position, site marked,  Risks and benefits discussed,  Surgical consent,  Pre-op evaluation,  At surgeon's request and post-op pain management  Laterality: Right and Lower  Prep: chloraprep       Needles:  Injection technique: Single-shot  Needle Type: Echogenic Stimulator Needle     Needle Length: 10cm  Needle Gauge: 20   Needle insertion depth: 8 cm   Additional Needles:   Procedures:, nerve stimulator,,, ultrasound used (permanent image in chart),,     Nerve Stimulator or Paresthesia:  Response: Twitch elicited, 0.6 mA, 8 cm  Additional Responses:   Narrative:  Start time: 05/09/2022 7:00 AM End time: 05/09/2022 7:10 AM Injection made incrementally with aspirations every 5 mL.  Performed by: Personally  Anesthesiologist: Denese Killings, MD  Additional Notes: BP cuff, EKG monitors applied. Sedation begun. After nerve location anesthetic injected incrementally, slowly , and after neg aspirations. Tolerated well.

## 2022-05-09 NOTE — H&P (Signed)
Chief Complaint  Patient presents with   Knee Pain - right    Please see the notes noted below.  Patient was sent to me by Dr. Dwain Sarna as the total joint replacement at his hospital are being deferred secondary to some infection issues   The patient tells me that she is having diffuse right knee pain and inability to weight-bear.  This is also causing her some anxiety and depression and she is taking escitalopram 10 mg   She is using a cane   As indicated she has had multiple treatments and surgeries        Past Surgical History:  Procedure Laterality Date   KNEE ARTHROSCOPY       MENISCUS REPAIR       MENISECTOMY       ORTHOPEDIC SURGERY        History reviewed. No pertinent family history.   Social History         Tobacco Use   Smoking status: Former      Types: Cigarettes      Quit date: 2016      Years since quitting: 8.1   Smokeless tobacco: Never  Vaping Use   Vaping Use: Never used  Substance Use Topics   Alcohol use: No   Drug use: No          Past Medical History:  Diagnosis Date   Anxiety     Gout     Hypertension     Panic attack       Allergies  Allergen Reactions   Codeine Nausea And Vomiting   Penicillins Hives    Has patient had a PCN reaction causing immediate rash, facial/tongue/throat swelling, SOB or lightheadedness with hypotension: No Has patient had a PCN reaction causing severe rash involving mucus membranes or skin necrosis: No Has patient had a PCN reaction that required hospitalization: No Has patient had a PCN reaction occurring within the last 10 years: Yes If all of the above answers are "NO", then may proceed with Cephalosporin use.    Sulfa Antibiotics Hives and Itching   Latex Rash    Powder in latex gloves causes rash   Meds ordered this encounter  Medications   lactated ringers infusion   OR Linked Order Group    chlorhexidine (PERIDEX) 0.12 % solution 15 mL    Oral care mouth rinse   vancomycin (VANCOREADY) IVPB  1500 mg/300 mL    Order Specific Question:   Indication:    Answer:   Surgical Prophylaxis   tranexamic acid (CYKLOKAPRON) IVPB 1,000 mg   scopolamine (TRANSDERM-SCOP) 1 MG/3DAYS 1.5 mg   midazolam (VERSED) injection 2 mg   fentaNYL (SUBLIMAZE) injection 100 mcg   midazolam (VERSED) 2 MG/2ML injection    [place] Floor: cabinet override   fentaNYL (SUBLIMAZE) 50 MCG/ML injection    [address] Rao W: cabinet override   scopolamine (TRANSDERM-SCOP) 1 MG/3DAYS    [place] Floor: cabinet override      She indicates on review of systems that she has felt tired heart palpitations pain in her legs after walking shortness of breath heartburn frequency joint pain depression nervousness pollen allergies   She has had a history of pneumonia hypertension GERD depression anxiety arthritis bronchitis         Patient is a pleasant 57 year old female who comes in today for follow-up of her chronic right knee pain in the setting of degenerative arthritis. Her history is previously well-documented but briefly, she has a  history of 3 arthroscopic surgeries for meniscectomies and chondroplasties, her most recent performed proximally 5-6 years ago by Dr. Case. Her most recent surgery provided her no relief though she has previously been told that she would need a knee replacement at some point. Since then, she has been managed nonoperatively with a variety of measures including oral medications as well as steroid injections. Her most recent steroid injection provided her little to no relief and since then, she has had significant 10/10 pain to her knee that she describes as a constant sharp and throbbing pain that does interfere with her ability to sleep at night. She takes ibuprofen and Tylenol for pain which helps minimally. She denies any numbness or tingling. 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 She does occasionally use a cane to assist with  ambulation.  ED note from 01/21/22: Gertude Wecker is a 57 y.o. female who presents today to the emergency department complaining of increased right knee pain and swelling this morning. She has a history of chronic knee problems and reports having had 3 arthroscopies on her right knee, but states that she woke up this morning and had increased pain, swelling and difficulty straightening her knee. She rates the pain as moderate.   Last clinic note from 06/18/20: Patient is a very pleasant 57 year old female who presents today for ongoing right knee pain and swelling. Patient has a history of knee arthroscopy performed by Dr. Case here in [place]. Please see note below. Patient states she has ongoing pain to the right knee. She does endorse intermittent effusions. Patient has a noted antalgic gait and limps. She walks without an assistive device. No recent interval trauma or falls. Patient was unable to continue work due to ongoing right knee pain.      BP (!) 155/100   Pulse 84   Ht 5\' 9"  (1.753 m)   Wt 253 lb (114.8 kg)   BMI 37.36 kg/m    Physical Exam Vitals and nursing note reviewed.  Constitutional:      Appearance: Normal appearance.  HENT:     Head: Normocephalic and atraumatic.  Eyes:     General: No scleral icterus.       Right eye: No discharge.        Left eye: No discharge.     Extraocular Movements: Extraocular movements intact.     Conjunctiva/sclera: Conjunctivae normal.     Pupils: Pupils are equal, round, and reactive to light.  Cardiovascular:     Rate and Rhythm: Normal rate.     Pulses: Normal pulses.  Musculoskeletal:     Right lower leg: Edema present.     Left lower leg: Edema present.     Comments: The right knee appears to be in valgus.  The effusion   0-90 active and passive   The tenderness is in the diffuse around the patellofemoral joint lateral compartment medial compartment   Extensor mechanism is intact   All 4 ligaments are stable      Skin:    General: Skin is warm and dry.     Capillary Refill: Capillary refill takes less than 2 seconds.  Neurological:     General: No focal deficit present.     Mental Status: She is alert and oriented to person, place, and time.  Psychiatric:        Mood and Affect: Mood normal.        Behavior: Behavior  normal.        Thought Content: Thought content normal.        Judgment: Judgment normal.      Images were obtained via a disc   I had to repeat them because there was no patellofemoral view to plan for surgery   The images that we have on the discs show grade 4 arthritis mild valgus deformity notable osteophytes and subchondral sclerosis   New images   Grade 4 arthritis lateral compartment mild valgus with multiple osteophytes normal patellofemoral tracking and alignment with peripheral osteophytes   Assessment and plan   58 year old female the plan is to do a right total knee for right knee arthritis   I have informed her that she is at high risk.  She is just meeting the BMI criteria with a BMI of 37, she is recently diagnosed with depression but is on medication, usual risk of DVT postop pain and stiffness with only preop 90 degree flexion.

## 2022-05-09 NOTE — Transfer of Care (Signed)
Immediate Anesthesia Transfer of Care Note  Patient: Taylor Ingram  Procedure(s) Performed: TOTAL KNEE ARTHROPLASTY (Right: Knee)  Patient Location: PACU  Anesthesia Type:Spinal  Level of Consciousness: awake, alert , oriented, and patient cooperative  Airway & Oxygen Therapy: Patient Spontanous Breathing and Patient connected to nasal cannula oxygen  Post-op Assessment: Report given to RN, Post -op Vital signs reviewed and stable, and Patient moving all extremities  Post vital signs: Reviewed and stable  Last Vitals:  Vitals Value Taken Time  BP 114/64 05/09/22 1014  Temp 97.5 05/09/22 1014  Pulse 83 05/09/22 1013  Resp 15 05/09/22 1013  SpO2 100 % 05/09/22 1013  Vitals shown include unvalidated device data.  Last Pain:  Vitals:   05/09/22 0628  PainSc: 0-No pain         Complications: No notable events documented.

## 2022-05-09 NOTE — Evaluation (Signed)
Physical Therapy Evaluation Patient Details Name: Taylor Ingram MRN: [id number] DOB: August 19, 1965 Today's Date: 05/09/2022   RIGHT KNEE ROM:  0 - 95 degrees AMBULATION DISTANCE: 100 feet using RW with Modified Independent/Supervision   History of Present Illness  Taylor Ingram  is a 57 y/o female, s/p Right TKA on 05/09/22, with the diagnosis of right OA.  Clinical Impression  Patient instructed in and given written instructions for HEP with good return demonstrated and understanding acknowledged, ambulated with fair/good return for right heel to toe stepping without loss of balance, good return for going up/down steps in stairwell using bilateral side rails without loss of balance and tolerated sitting up in chair with RLE dangling after therapy - nurse notified.  Patient will benefit from continued skilled physical therapy in hospital and recommended venue below to increase strength, balance, endurance for safe ADLs and gait.        Recommendations for follow up therapy are one component of a multi-disciplinary discharge planning process, led by the attending physician.  Recommendations may be updated based on patient status, additional functional criteria and insurance authorization.  Follow Up Recommendations Home health PT      Assistance Recommended at Discharge Set up Supervision/Assistance  Patient can return home with the following  A little help with walking and/or transfers;A little help with bathing/dressing/bathroom;Help with stairs or ramp for entrance;Assistance with cooking/housework    Equipment Recommendations None recommended by PT  Recommendations for Other Services       Functional Status Assessment Patient has had a recent decline in their functional status and demonstrates the ability to make significant improvements in function in a reasonable and predictable amount of time.     Precautions / Restrictions Precautions Precautions:  Fall Restrictions Weight Bearing Restrictions: Yes RLE Weight Bearing: Weight bearing as tolerated      Mobility  Bed Mobility Overal bed mobility: Independent                  Transfers Overall transfer level: Needs assistance Equipment used: Rolling walker (2 wheels) Transfers: Sit to/from Stand, Bed to chair/wheelchair/BSC Sit to Stand: Supervision   Step pivot transfers: Supervision       General transfer comment: labored movement for completing sit to stands from bedside, w/c    Ambulation/Gait Ambulation/Gait assistance: Supervision, Modified independent (Device/Increase time) Gait Distance (Feet): 100 Feet Assistive device: Rolling walker (2 wheels) Gait Pattern/deviations: Decreased step length - right, Decreased step length - left, Decreased stance time - right, Decreased stride length, Antalgic Gait velocity: decreased     General Gait Details: slightly labored cadence with fair/good return for right heel to toe stepping without loss of balance, limited mostly due to fatigue, right knee pain at baseline  Stairs Stairs: Yes Stairs assistance: Supervision Stair Management: Two rails Number of Stairs: 3 General stair comments: demonstrates good return for going up/down 3 steps using bilateral side rails without loss of balance and understanding acknowledged  Wheelchair Mobility    Modified Rankin (Stroke Patients Only)       Balance Overall balance assessment: Needs assistance Sitting-balance support: Feet supported, No upper extremity supported Sitting balance-Leahy Scale: Good Sitting balance - Comments: seated at EOB   Standing balance support: During functional activity, Bilateral upper extremity supported Standing balance-Leahy Scale: Fair Standing balance comment: fair/good using RW                             Pertinent  Vitals/Pain Pain Assessment Pain Assessment: 0-10 Pain Score: 7  Pain Location: right knee Pain  Descriptors / Indicators: Sore, Discomfort Pain Intervention(s): Limited activity within patient's tolerance, Monitored during session, Repositioned    Home Living Family/patient expects to be discharged to:: Private residence Living Arrangements: Alone Available Help at Discharge: Family;Available 24 hours/day Type of Home: House Home Access: Stairs to enter;Level entry Entrance Stairs-Rails: None Entrance Stairs-Number of Steps: 3 steps in front without side rails, level entry in back (which she plans to use)   Home Layout: One level Home Equipment: Conservation officer, nature (2 wheels)      Prior Function Prior Level of Function : Independent/Modified Independent;Driving             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, drives, shops ADLs Comments: Independent     Hand Dominance   Dominant Hand: Right    Extremity/Trunk Assessment   Upper Extremity Assessment Upper Extremity Assessment: Overall WFL for tasks assessed    Lower Extremity Assessment Lower Extremity Assessment: Overall WFL for tasks assessed;RLE deficits/detail RLE Deficits / Details: grossly 4/5 RLE: Unable to fully assess due to pain RLE Sensation: WNL RLE Coordination: WNL    Cervical / Trunk Assessment Cervical / Trunk Assessment: Normal  Communication   Communication: No difficulties  Cognition Arousal/Alertness: Awake/alert Behavior During Therapy: WFL for tasks assessed/performed Overall Cognitive Status: Within Functional Limits for tasks assessed                                          General Comments      Exercises Total Joint Exercises Ankle Circles/Pumps: Supine, 10 reps, Right, Strengthening, AROM Quad Sets: AROM, Strengthening, Right, 10 reps, Supine Short Arc Quad: AROM, Strengthening, 10 reps, Supine, Right Heel Slides: AROM, Strengthening, Right, 10 reps, Supine Goniometric ROM: Right knee: 0 - 95 degrees   Assessment/Plan    PT  Assessment Patient needs continued PT services  PT Problem List Decreased strength;Decreased activity tolerance;Decreased range of motion;Decreased balance;Decreased mobility       PT Treatment Interventions DME instruction;Gait training;Stair training;Functional mobility training;Therapeutic activities;Therapeutic exercise;Patient/family education;Balance training    PT Goals (Current goals can be found in the Care Plan section)  Acute Rehab PT Goals Patient Stated Goal: return home with friends, neighbors to assist PT Goal Formulation: With patient Time For Goal Achievement: 06/08/22 Potential to Achieve Goals: Good    Frequency BID     Co-evaluation               AM-PAC PT "6 Clicks" Mobility  Outcome Measure Help needed turning from your back to your side while in a flat bed without using bedrails?: None Help needed moving from lying on your back to sitting on the side of a flat bed without using bedrails?: None Help needed moving to and from a bed to a chair (including a wheelchair)?: A Little Help needed standing up from a chair using your arms (e.g., wheelchair or bedside chair)?: A Little Help needed to walk in hospital room?: A Little Help needed climbing 3-5 steps with a railing? : A Little 6 Click Score: 20    End of Session   Activity Tolerance: Patient tolerated treatment well;Patient limited by fatigue Patient left: in chair;with call bell/phone within reach Nurse Communication: Mobility status PT Visit Diagnosis: Unsteadiness on feet (R26.81);Other abnormalities of gait and mobility (R26.89);Muscle weakness (generalized) (M62.81)  Time: [id number] PT Time Calculation (min) (ACUTE ONLY): 34 min   Charges:   PT Evaluation $PT Eval Moderate Complexity: 1 Mod PT Treatments $Gait Training: 8-22 mins $Therapeutic Exercise: 8-22 mins        3:19 PM, 05/09/22 Lonell Grandchild, MPT Physical Therapist with [facility] [phone]  office [phone] mobile phone

## 2022-05-09 NOTE — Anesthesia Preprocedure Evaluation (Signed)
Anesthesia Evaluation  Patient identified by MRN, date of birth, ID band Patient awake    Reviewed: Allergy & Precautions, H&P , NPO status , Patient's Chart, lab work & pertinent test results  History of Anesthesia Complications (+) PONV and history of anesthetic complications  Airway Mallampati: II  TM Distance: >3 FB Neck ROM: Full    Dental  (+) Dental Advisory Given, Loose,    Pulmonary neg pulmonary ROS, former smoker   Pulmonary exam normal breath sounds clear to auscultation       Cardiovascular Exercise Tolerance: Good hypertension, Pt. on medications Normal cardiovascular exam Rhythm:Regular Rate:Normal     Neuro/Psych  PSYCHIATRIC DISORDERS Anxiety     negative neurological ROS     GI/Hepatic Neg liver ROS,GERD  Medicated and Controlled,,  Endo/Other  diabetes (pre)    Renal/GU negative Renal ROS  negative genitourinary   Musculoskeletal  (+) Arthritis  (gout), Osteoarthritis,    Abdominal   Peds negative pediatric ROS (+)  Hematology negative hematology ROS (+)   Anesthesia Other Findings   Reproductive/Obstetrics negative OB ROS                             Anesthesia Physical Anesthesia Plan  ASA: 2  Anesthesia Plan: Spinal   Post-op Pain Management: Regional block* and Dilaudid IV   Induction: Intravenous  PONV Risk Score and Plan: 3 and Ondansetron, Dexamethasone, Scopolamine patch - Pre-op and Midazolam  Airway Management Planned: Nasal Cannula and Natural Airway  Additional Equipment:   Intra-op Plan:   Post-operative Plan:   Informed Consent: I have reviewed the patients History and Physical, chart, labs and discussed the procedure including the risks, benefits and alternatives for the proposed anesthesia with the patient or authorized representative who has indicated his/her understanding and acceptance.     Dental advisory given  Plan Discussed  with: CRNA and Surgeon  Anesthesia Plan Comments:         Anesthesia Quick Evaluation

## 2022-05-09 NOTE — Anesthesia Procedure Notes (Signed)
Procedure Name: MAC Date/Time: 05/09/2022 7:28 AM  Performed by: Maude Leriche, CRNAPre-anesthesia Checklist: Patient identified, Emergency Drugs available, Suction available, Patient being monitored and Timeout performed Patient Re-evaluated:Patient Re-evaluated prior to induction Oxygen Delivery Method: Nasal cannula Placement Confirmation: positive ETCO2 Dental Injury: Teeth and Oropharynx as per pre-operative assessment

## 2022-05-09 NOTE — TOC Transition Note (Signed)
Transition of Care [facility]) - CM/SW Discharge Note   Patient Details  Name: Taylor Ingram MRN: [id number] Date of Birth: 10-15-1965  [facility] of [facility]) CM/SW Contact:  Iona Beard, [place] Phone Number: 05/09/2022, 3:38 PM   Clinical Narrative:    CSW updated that PT is recommending [place] [facility] for pt at D/C. CSW spoke to local [facility] agencies, none are able to accept pts [place] referral at this time. CSW explained this to pt and she is understanding. CSW inquired about pts interest in outpatient PT referral. Pt states she is agreeable to this. CSW made OP PT referral at this time. TOC signing off.   Final next level of care: OP Rehab Barriers to Discharge: Continued Medical Work up   Patient Goals and CMS Choice CMS Medicare.gov Compare Post Acute Care list provided to:: Patient Choice offered to / list presented to : Patient  Discharge Placement                         Discharge Plan and Services Additional resources added to the After Visit Summary for                                       Social Determinants of Health (SDOH) Interventions SDOH Screenings   Tobacco Use: Medium Risk (05/02/2022)     Readmission Risk Interventions     No data to display

## 2022-05-09 NOTE — Anesthesia Procedure Notes (Signed)
Spinal  Patient location during procedure: OR Start time: 05/09/2022 7:36 AM End time: 05/09/2022 7:46 AM Reason for block: surgical anesthesia Staffing Performed: anesthesiologist and resident/CRNA  Performed by: Denese Killings, MD Authorized by: Denese Killings, MD   Preanesthetic Checklist Completed: patient identified, IV checked, site marked, risks and benefits discussed, surgical consent, monitors and equipment checked, pre-op evaluation and timeout performed Spinal Block Patient position: sitting Prep: ChloraPrep Patient monitoring: heart rate, cardiac monitor, continuous pulse ox and blood pressure Approach: midline Location: L4-5 Injection technique: single-shot Needle Needle type: Sprotte  Needle gauge: 22 G Needle length: 15 cm Needle insertion depth: 10 cm Assessment Events: CSF return

## 2022-05-09 NOTE — Brief Op Note (Signed)
05/09/2022  9:59 AM  PATIENT:  Taylor Ingram  57 y.o. female  PRE-OPERATIVE DIAGNOSIS:  Right Knee Osteoarthritis  POST-OPERATIVE DIAGNOSIS:  Right Knee Osteoarthritis  PROCEDURE:  Procedure(s): TOTAL KNEE ARTHROPLASTY (Right)  SURGEON:  Surgeon(s) and Role:    Carole Civil, MD - Primary  PHYSICIAN ASSISTANT:   ASSISTANTS: Fulton Mole and Delene Ruffini  ANESTHESIA:   spinal and adductor canal block  EBL:  20cc   BLOOD ADMINISTERED:none  DRAINS: none   LOCAL MEDICATIONS USED:  OTHER zinrelef  SPECIMEN:  No Specimen  DISPOSITION OF SPECIMEN:  N/A  COUNTS:  YES  TOURNIQUET:   Total Tourniquet Time Documented: Thigh (Right) - 99 minutes Total: Thigh (Right) - 99 minutes   DICTATION: .Viviann Spare Dictation  PLAN OF CARE: Admit for overnight observation  PATIENT DISPOSITION:  PACU - hemodynamically stable.   Delay start of Pharmacological VTE agent (>24hrs) due to surgical blood loss or risk of bleeding: yes

## 2022-05-10 DIAGNOSIS — M1711 Unilateral primary osteoarthritis, right knee: Secondary | ICD-10-CM | POA: Diagnosis not present

## 2022-05-10 LAB — BASIC METABOLIC PANEL
Anion gap: 10 (ref 5–15)
BUN: 17 mg/dL (ref 6–20)
CO2: 26 mmol/L (ref 22–32)
Calcium: 8.7 mg/dL — ABNORMAL LOW (ref 8.9–10.3)
Chloride: 100 mmol/L (ref 98–111)
Creatinine, Ser: 0.8 mg/dL (ref 0.44–1.00)
GFR, Estimated: >60 mL/min (ref >60)
Glucose, Bld: 122 mg/dL — ABNORMAL HIGH (ref 70–99)
Potassium: 3.7 mmol/L (ref 3.5–5.1)
Sodium: 136 mmol/L (ref 135–145)

## 2022-05-10 LAB — CBC
HCT: 33.5 % — ABNORMAL LOW (ref 36.0–46.0)
Hemoglobin: 10.6 g/dL — ABNORMAL LOW (ref 12.0–15.0)
MCH: 27.5 pg (ref 26.0–34.0)
MCHC: 31.6 g/dL (ref 30.0–36.0)
MCV: 86.8 fL (ref 80.0–100.0)
Platelets: 302 10*3/uL (ref 150–400)
RBC: 3.86 MIL/uL — ABNORMAL LOW (ref 3.87–5.11)
RDW: 14.5 % (ref 11.5–15.5)
WBC: 19 10*3/uL — ABNORMAL HIGH (ref 4.0–10.5)
nRBC: 0 % (ref 0.0–0.2)

## 2022-05-10 MED ORDER — DOCUSATE SODIUM 100 MG PO CAPS: 100.0000 mg | ORAL_CAPSULE | Freq: Two times a day (BID) | ORAL | 0 refills | Status: AC

## 2022-05-10 MED ORDER — METHOCARBAMOL 500 MG PO TABS: 500.0000 mg | ORAL_TABLET | Freq: Four times a day (QID) | ORAL | 2 refills | Status: AC | PRN

## 2022-05-10 MED ORDER — ASPIRIN 325 MG PO TBEC: 325.0000 mg | DELAYED_RELEASE_TABLET | Freq: Every day | ORAL | 0 refills | Status: AC

## 2022-05-10 MED ORDER — NAPROXEN 250 MG PO TABS: 250.0000 mg | ORAL_TABLET | Freq: Two times a day (BID) | ORAL | 1 refills | Status: AC

## 2022-05-10 MED ORDER — HYDROCODONE-ACETAMINOPHEN 5-325 MG PO TABS: 1.0000 | ORAL_TABLET | ORAL | 0 refills | Status: AC | PRN

## 2022-05-10 MED ORDER — POLYETHYLENE GLYCOL 3350 17 G PO PACK: 17.0000 g | PACK | Freq: Every day | ORAL | 0 refills | Status: AC | PRN

## 2022-05-10 MED ORDER — TRAMADOL HCL 50 MG PO TABS: 50.0000 mg | ORAL_TABLET | Freq: Four times a day (QID) | ORAL | 0 refills | Status: DC

## 2022-05-10 NOTE — Discharge Summary (Signed)
Physician Discharge Summary  Patient ID: Taylor Ingram MRN: [id number] DOB/AGE: 1965-03-07 57 y.o.  Admit date: 05/09/2022 Discharge date: 05/10/2022  Admission Diagnoses: Severe arthritis right knee  Discharge Diagnoses:  Principal Problem:   Primary osteoarthritis of right knee   Discharged Condition: Good  Hospital Course: Unremarkable  Consults: None  Significant Diagnostic Studies: labs:     Latest Ref Rng & Units 05/10/2022    5:48 AM 05/02/2022   10:21 AM  CBC  WBC 4.0 - 10.5 K/uL 19.0  8.9   Hemoglobin 12.0 - 15.0 g/dL 10.6  12.2   Hematocrit 36.0 - 46.0 % 33.5  38.9   Platelets 150 - 400 K/uL 302  306        Latest Ref Rng & Units 05/10/2022    5:48 AM 05/02/2022   10:21 AM  BMP  Glucose 70 - 99 mg/dL 122  92   BUN 6 - 20 mg/dL 17  18   Creatinine 0.44 - 1.00 mg/dL 0.80  0.84   Sodium 135 - 145 mmol/L 136  136   Potassium 3.5 - 5.1 mmol/L 3.7  3.6   Chloride 98 - 111 mmol/L 100  100   CO2 22 - 32 mmol/L 26  26   Calcium 8.9 - 10.3 mg/dL 8.7  9.0      Treatments: surgery: Right total knee arthroplasty  Implants attune fixed-bearing posterior stabilized total knee arthroplasty  Discharge Exam: Blood pressure 130/79, pulse 73, temperature 97.7 F (36.5 C), temperature source Oral, resp. rate 14, height 5\' 8"  (1.727 m), weight 121 kg, last menstrual period 07/05/2011, SpO2 98 %. Mental status awake alert and oriented x 3 Neurologic status normal function of the right lower extremity Vascular status no signs of DVT normal pulse perfusion capillary refill Dressing dry and intact   Disposition: Discharge disposition: 01-Home or Self Care       Discharge Instructions     Ambulatory referral to Physical Therapy   Complete by: As directed    Call MD / Call 911   Complete by: As directed    If you experience chest pain or shortness of breath, CALL 911 and be transported to the hospital emergency room.  If you develope a fever above 101 F, pus  (white drainage) or increased drainage or redness at the wound, or calf pain, call your surgeon's office.   Constipation Prevention   Complete by: As directed    Drink plenty of fluids.  Prune juice may be helpful.  You may use a stool softener, such as Colace (over the counter) 100 mg twice a day.  Use MiraLax (over the counter) for constipation as needed.   Diet - low sodium heart healthy   Complete by: As directed    Discharge instructions   Complete by: As directed    Blue bone foam goes under your heel.  Use it 30 minutes at a time 4 times a day for the first 2 weeks  Use the CPM machine to move your knee started at 80 degrees, increase 10 degrees/day up to 110 degrees-120 degrees, use for 2 weeks  You can leave the dressing on.  Do not take a shower  Do the exercises as instructed by physical therapy  For pain you have naproxen twice a day  Tramadol every 6 hours and then hydrocodone if the tramadol is not controlling the pain  Use extra ice if the knee is hurting  You should at least have the Cryo/Cuff on for  30 minutes 6 times a day   Increase activity slowly as tolerated   Complete by: As directed    Post-operative opioid taper instructions:   Complete by: As directed    POST-OPERATIVE OPIOID TAPER INSTRUCTIONS: It is important to wean off of your opioid medication as soon as possible. If you do not need pain medication after your surgery it is ok to stop day one. Opioids include: Codeine, Hydrocodone(Norco, Vicodin), Oxycodone(Percocet, oxycontin) and hydromorphone amongst others.  Long term and even short term use of opiods can cause: Increased pain response Dependence Constipation Depression Respiratory depression And more.  Withdrawal symptoms can include Flu like symptoms Nausea, vomiting And more Techniques to manage these symptoms Hydrate well Eat regular healthy meals Stay active Use relaxation techniques(deep breathing, meditating, yoga) Do Not  substitute Alcohol to help with tapering If you have been on opioids for less than two weeks and do not have pain than it is ok to stop all together.  Plan to wean off of opioids This plan should start within one week post op of your joint replacement. Maintain the same interval or time between taking each dose and first decrease the dose.  Cut the total daily intake of opioids by one tablet each day Next start to increase the time between doses. The last dose that should be eliminated is the evening dose.         Allergies as of 05/10/2022       Reactions   Codeine Nausea And Vomiting   Penicillins Hives   Has patient had a PCN reaction causing immediate rash, facial/tongue/throat swelling, SOB or lightheadedness with hypotension: No Has patient had a PCN reaction causing severe rash involving mucus membranes or skin necrosis: No Has patient had a PCN reaction that required hospitalization: No Has patient had a PCN reaction occurring within the last 10 years: Yes If all of the above answers are "NO", then may proceed with Cephalosporin use.   Sulfa Antibiotics Hives, Itching   Latex Rash   Powder in latex gloves causes rash        Medication List     STOP taking these medications    ibuprofen 200 MG tablet Commonly known as: ADVIL       TAKE these medications    aspirin EC 325 MG tablet Take 1 tablet (325 mg total) by mouth daily with breakfast. Start taking on: May 11, 2022   docusate sodium 100 MG capsule Commonly known as: COLACE Take 1 capsule (100 mg total) by mouth 2 (two) times daily.   escitalopram 10 MG tablet Commonly known as: LEXAPRO Take 10 mg by mouth daily.   hydrochlorothiazide 25 MG tablet Commonly known as: HYDRODIURIL Take 25 mg by mouth daily.   HYDROcodone-acetaminophen 5-325 MG tablet Commonly known as: NORCO/VICODIN Take 1-2 tablets by mouth every 4 (four) hours as needed for up to 7 days for severe pain. Postop severe pain if  tramadol not working   methocarbamol 500 MG tablet Commonly known as: ROBAXIN Take 1 tablet (500 mg total) by mouth every 6 (six) hours as needed for muscle spasms.   naproxen 250 MG tablet Commonly known as: NAPROSYN Take 1 tablet (250 mg total) by mouth 2 (two) times daily with a meal.   omeprazole 40 MG capsule Commonly known as: PRILOSEC Take 40 mg by mouth daily.   polyethylene glycol 17 g packet Commonly known as: MIRALAX / GLYCOLAX Take 17 g by mouth daily as needed for mild constipation.  traMADol 50 MG tablet Commonly known as: ULTRAM Take 1 tablet (50 mg total) by mouth 4 (four) times daily for 7 days. Postop pain   triamcinolone cream 0.1 % Commonly known as: KENALOG Apply 1 Application topically 2 (two) times daily as needed (itching).         Signed: Arther Abbott 05/10/2022, 12:19 PM

## 2022-05-10 NOTE — Progress Notes (Signed)
Pt's foley removed this a.m. per order. Pt compliant with Iceman and bone foam through the night. Pt has been up and ambulating independently in room with standby assist.

## 2022-05-10 NOTE — Progress Notes (Signed)
Pt discharged to home. DC instructions given with female family member at bedside. No concerns voiced. Pt left unit in wheelchair pushed by Terri, NT. Left in stable condition.

## 2022-05-15 LAB — TYPE AND SCREEN
ABO/RH(D): A POS
Antibody Screen: NEGATIVE
Unit division: 0
Unit division: 0

## 2022-05-15 LAB — BPAM RBC
Blood Product Expiration Date: 202404182359
Blood Product Expiration Date: 202404182359
Unit Type and Rh: 6200
Unit Type and Rh: 6200

## 2022-05-19 ENCOUNTER — Telehealth: Payer: Self-pay

## 2022-05-19 NOTE — Telephone Encounter (Signed)
Yes, she needs to continue lexapro. I called her to advise/ left message told her to call back and let me know she got the message. If not I will call her again this afternoon.

## 2022-05-19 NOTE — Telephone Encounter (Signed)
Taylor Ingram left message wanting to know if she needs to continue her Lexapro, since she is on all the other medications.  She had R Knee Replacement 05/09/22?   Please advise [phone], I will call her if you want me to.

## 2022-05-21 ENCOUNTER — Encounter (HOSPITAL_COMMUNITY): Payer: Self-pay | Admitting: Orthopedic Surgery

## 2022-05-21 DIAGNOSIS — Z96651 Presence of right artificial knee joint: Secondary | ICD-10-CM | POA: Insufficient documentation

## 2022-05-23 ENCOUNTER — Other Ambulatory Visit: Payer: Self-pay | Admitting: Radiology

## 2022-05-23 ENCOUNTER — Encounter: Payer: Self-pay | Admitting: Orthopedic Surgery

## 2022-05-23 ENCOUNTER — Ambulatory Visit (INDEPENDENT_AMBULATORY_CARE_PROVIDER_SITE_OTHER): Payer: Medicaid Other | Admitting: Orthopedic Surgery

## 2022-05-23 DIAGNOSIS — Z96651 Presence of right artificial knee joint: Secondary | ICD-10-CM

## 2022-05-23 MED ORDER — HYDROCODONE-ACETAMINOPHEN 5-325 MG PO TABS: 1.0000 | ORAL_TABLET | Freq: Four times a day (QID) | ORAL | 0 refills | Status: DC | PRN

## 2022-05-23 MED ORDER — TRAMADOL HCL 50 MG PO TABS: 50.0000 mg | ORAL_TABLET | Freq: Four times a day (QID) | ORAL | 0 refills | Status: AC

## 2022-05-23 NOTE — Patient Instructions (Signed)
Allergies as of 05/23/2022       Reactions   Codeine Nausea And Vomiting   Penicillins Hives   Has patient had a PCN reaction causing immediate rash, facial/tongue/throat swelling, SOB or lightheadedness with hypotension: No Has patient had a PCN reaction causing severe rash involving mucus membranes or skin necrosis: No Has patient had a PCN reaction that required hospitalization: No Has patient had a PCN reaction occurring within the last 10 years: Yes If all of the above answers are "NO", then may proceed with Cephalosporin use.   Sulfa Antibiotics Hives, Itching   Latex Rash   Powder in latex gloves causes rash        Medication List        Accurate as of May 23, 2022 11:46 AM. If you have any questions, ask your nurse or doctor.          aspirin EC 325 MG tablet Take 1 tablet (325 mg total) by mouth daily with breakfast.   docusate sodium 100 MG capsule Commonly known as: COLACE Take 1 capsule (100 mg total) by mouth 2 (two) times daily.   escitalopram 10 MG tablet Commonly known as: LEXAPRO Take 10 mg by mouth daily.   hydrochlorothiazide 25 MG tablet Commonly known as: HYDRODIURIL Take 25 mg by mouth daily.   methocarbamol 500 MG tablet Commonly known as: ROBAXIN Take 1 tablet (500 mg total) by mouth every 6 (six) hours as needed for muscle spasms.   naproxen 250 MG tablet Commonly known as: NAPROSYN Take 1 tablet (250 mg total) by mouth 2 (two) times daily with a meal.   omeprazole 40 MG capsule Commonly known as: PRILOSEC Take 40 mg by mouth daily.   polyethylene glycol 17 g packet Commonly known as: MIRALAX / GLYCOLAX Take 17 g by mouth daily as needed for mild constipation.   triamcinolone cream 0.1 % Commonly known as: KENALOG Apply 1 Application topically 2 (two) times daily as needed (itching).

## 2022-05-23 NOTE — Telephone Encounter (Signed)
Patient states Tramadol doesn't help she needs hydrocodone, looks like it didn t send it was set to print will you please resend the hydrocodone ?

## 2022-05-23 NOTE — Telephone Encounter (Signed)
SHOULD TAKE BOTH   TRAMADOL EV 6 THEN HYDROCODONE IF STILL IN PAIN

## 2022-05-23 NOTE — Progress Notes (Signed)
Chief Complaint  Patient presents with   Post-op Follow-up    Right knee 05/09/22 starts outpatient therapy April 11th / needs refill Oxycodon e    Right total knee postop visit 1 postop day #14  Doing well  Staples removed Steri-Strips applied bandage applied  She is up and about using her cane good gait heel-to-toe  Medications refilled  Meds ordered this encounter  Medications   HYDROcodone-acetaminophen (NORCO/VICODIN) 5-325 MG tablet    Sig: Take 1 tablet by mouth every 6 (six) hours as needed for severe pain.    Dispense:  6 tablet    Refill:  0   traMADol (ULTRAM) 50 MG tablet    Sig: Take 1 tablet (50 mg total) by mouth 4 (four) times daily for 7 days. Postop pain    Dispense:  28 tablet    Refill:  0   Return in 3 weeks

## 2022-05-28 ENCOUNTER — Telehealth: Payer: Self-pay | Admitting: Orthopedic Surgery

## 2022-05-28 NOTE — Telephone Encounter (Signed)
Dr. Mort Sawyers pt - pt lvm for Amy, stated that she removed the bandage that you put on there Friday.  She said there are a couple spots that have a green color to them, she wants to know what she needs to do.  [phone]

## 2022-05-28 NOTE — Telephone Encounter (Signed)
Thank you Amy.  I'm not clinical, wasn't sure if discoloration could be normal after surgery.  I have added to the patient to the schedule for tomorrow and the patient is aware.

## 2022-05-29 ENCOUNTER — Encounter: Payer: Self-pay | Admitting: Orthopedic Surgery

## 2022-05-29 ENCOUNTER — Ambulatory Visit (HOSPITAL_COMMUNITY): Payer: Medicaid Other

## 2022-05-29 ENCOUNTER — Ambulatory Visit (INDEPENDENT_AMBULATORY_CARE_PROVIDER_SITE_OTHER): Payer: Medicaid Other | Admitting: Orthopedic Surgery

## 2022-05-29 DIAGNOSIS — M1711 Unilateral primary osteoarthritis, right knee: Secondary | ICD-10-CM

## 2022-05-29 DIAGNOSIS — Z96651 Presence of right artificial knee joint: Secondary | ICD-10-CM

## 2022-05-29 NOTE — Progress Notes (Signed)
Chief Complaint  Patient presents with   Post-op Follow-up    Right knee 05/09/22    Encounter Diagnoses  Name Primary?   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

## 2022-06-03 ENCOUNTER — Other Ambulatory Visit: Payer: Self-pay | Admitting: Radiology

## 2022-06-03 ENCOUNTER — Other Ambulatory Visit: Payer: Self-pay | Admitting: Orthopedic Surgery

## 2022-06-03 DIAGNOSIS — Z96651 Presence of right artificial knee joint: Secondary | ICD-10-CM

## 2022-06-03 MED ORDER — HYDROCODONE-ACETAMINOPHEN 5-325 MG PO TABS: 1.0000 | ORAL_TABLET | Freq: Four times a day (QID) | ORAL | 0 refills | Status: DC | PRN

## 2022-06-03 NOTE — Telephone Encounter (Signed)
Hydrocodone declined at pharmacy for prior authorization, not approved, can only have 5 DAY SUPPLY with Medicaid. Pended 5 d supply, please resent

## 2022-06-03 NOTE — Telephone Encounter (Signed)
Dr. Mort Sawyers pt - spoke w/the pt, she is requesting a refill on Hydrocodone 5-325, 30 quantity, every 6 hours PRN for severe pain to be sent to [facility] in [place]

## 2022-06-10 NOTE — Therapy (Signed)
OUTPATIENT PHYSICAL THERAPY LOWER EXTREMITY EVALUATION   Patient Name: Taylor Ingram MRN: [id number] DOB:[birth year], 57 y.o., female Today's Date: 06/11/2022  END OF SESSION:  PT End of Session - 06/11/22 1400     Visit Number 1    Number of Visits 6    Date for PT Re-Evaluation 07/02/22    Authorization Type [place] Medicaid [employer]; submitted for auth please check    PT Start Time 1400    PT Stop Time 1435    PT Time Calculation (min) 35 min    Activity Tolerance Patient tolerated treatment well    Behavior During Therapy WFL for tasks assessed/performed             Past Medical History:  Diagnosis Date   Anxiety    Gout    Hypertension    Panic attack    PONV (postoperative nausea and vomiting)    Pre-diabetes    Past Surgical History:  Procedure Laterality Date   KNEE ARTHROSCOPY     MENISCUS REPAIR     MENISECTOMY     ORTHOPEDIC SURGERY     TOTAL KNEE ARTHROPLASTY Right 05/09/2022   Procedure: TOTAL KNEE ARTHROPLASTY;  Surgeon: Vickki Hearing, MD;  Location: [facility];  Service: Orthopedics;  Laterality: Right;   Patient Active Problem List   Diagnosis Date Noted   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 05/21/2022   Primary osteoarthritis of right knee 05/09/2022   History of arthroscopy of knee 05/14/2017   Loose body in knee, right 04/29/2017   Primary localized osteoarthrosis of right lower leg 10/29/2016    PCP: [facility]  REFERRING PROVIDER: Vickki Hearing, MD  REFERRING DIAG: M17.11 (ICD-10-CM) - Primary localized osteoarthrosis of right lower leg  THERAPY DIAG:  Osteoarthritis of right knee, unspecified osteoarthritis type  Difficulty in walking, not elsewhere classified  Stiffness of right knee, not elsewhere classified  Rationale for Evaluation and Treatment: Rehabilitation  ONSET DATE: 05/09/2022  SUBJECTIVE:   SUBJECTIVE STATEMENT: Ongoing knee pain for years; since 2009; multiple shots and 3  arthroscopic surgeries; finally had TKA per Romeo Apple.  Did not have home health; had CPM and a floor bike  PERTINENT HISTORY: na PAIN:  Are you having pain? Yes: NPRS scale: 5/10 Pain location: right knee Pain description: tight Aggravating factors: movement Relieving factors: pain medication  PRECAUTIONS: None  WEIGHT BEARING RESTRICTIONS: No  FALLS:  Has patient fallen in last 6 months? No  LIVING ENVIRONMENT: Lives with: lives alone Lives in: House/apartment Stairs: Yes: External: 4 steps; none Has following equipment at home: Single point cane  OCCUPATION: culinary school  PLOF: Independent  PATIENT GOALS: get it normal  NEXT MD VISIT: 06/13/2022  OBJECTIVE:   DIAGNOSTIC FINDINGS:  Narrative & Impression  CLINICAL DATA:  Status post right total knee replacement.   EXAM: PORTABLE RIGHT KNEE - 1-2 VIEW   COMPARISON:  April 07, 2022.   FINDINGS: The right femoral and tibial components are well situated. Expected postoperative changes are noted in the soft tissues anteriorly.   IMPRESSION: Status post right total knee arthroplasty.       PATIENT SURVEYS:  LEFS 43/80 53.8%  COGNITION: Overall cognitive status: Within functional limits for tasks assessed     SENSATION: WFL  EDEMA:  Normal for this time s/p  PALPATION:  Area lower min incision tender and red to touch; increased scabbing  LOWER EXTREMITY ROM:  Active ROM Right eval Left eval  Hip flexion    Hip  extension    Hip abduction    Hip adduction    Hip internal rotation    Hip external rotation    Knee flexion 112 123  Knee extension -12 -5  Ankle dorsiflexion    Ankle plantarflexion    Ankle inversion    Ankle eversion     (Blank rows = not tested)  LOWER EXTREMITY MMT:  MMT Right eval Left eval  Hip flexion 4 5  Hip extension    Hip abduction    Hip adduction    Hip internal rotation    Hip external rotation    Knee flexion    Knee extension 4+ 5  Ankle  dorsiflexion 4+ 5  Ankle plantarflexion    Ankle inversion    Ankle eversion     (Blank rows = not tested)  FUNCTIONAL TESTS:  5 times sit to stand: 20.93 sec 2 minute walk test: 251 ft with SPC  GAIT: Distance walked: 251 ft Assistive device utilized: Single point cane Level of assistance: Modified independence Comments: antalgic gait; slow gait speed   TODAY'S TREATMENT:                                                                                                                              DATE: 06/11/22 physical therapy evaluation and HEP instruction    PATIENT EDUCATION:  Education details: Patient educated on exam findings, POC, scope of PT, HEP, and what to expect next treatment. Person educated: Patient Education method: Explanation, Demonstration, and Handouts Education comprehension: verbalized understanding, returned demonstration, verbal cues required, and tactile cues required  HOME EXERCISE PROGRAM: Access Code: [email or web address] URL: [email or web address] Date: 06/11/2022 Prepared by: [facility]  Exercises - Supine Ankle Pumps  - 2 x daily - 7 x weekly - 1 sets - 15 reps - Supine Quadricep Sets  - 2 x daily - 7 x weekly - 1 sets - 15 reps - 5 sec hold - Supine Active Straight Leg Raise  - 2 x daily - 7 x weekly - 1 sets - 15 reps - Supine Short Arc Quad  - 2 x daily - 7 x weekly - 1 sets - 15 reps - Supine Heel Slides  - 2 x daily - 7 x weekly - 1 sets - 15 reps - Seated Long Arc Quad  - 2 x daily - 7 x weekly - 1 sets - 15 reps - Seated Knee Flexion Stretch  - 2 x daily - 7 x weekly - 1 sets - 15 reps - Heel Toe Raises with Counter Support  - 2 x daily - 7 x weekly - 1 sets - 10 reps - Mini Squat with Counter Support  - 2 x daily - 7 x weekly - 1 sets - 10 reps - Standing Hip Extension with Counter Support  - 2 x daily - 7 x weekly - 1 sets - 10 reps - Standing Hip Abduction with Counter Support  -  2 x daily - 7 x weekly - 1 sets - 10  reps   ASSESSMENT:  CLINICAL IMPRESSION: Patient is a 58 y.o. female who was seen today for physical therapy evaluation and treatment for M17.11 (ICD-10-CM) - Primary localized osteoarthrosis of right lower leg. Patient  presents to physical therapy with complaint of Right knee pain and stiffness. Patient demonstrates muscle weakness, reduced ROM, and fascial restrictions which are likely contributing to symptoms of pain and are negatively impacting patient ability to perform ADLs and functional mobility tasks. Patient will benefit from skilled physical therapy services to address these deficits to reduce pain and improve level of function with ADLs and functional mobility tasks.   OBJECTIVE IMPAIRMENTS: Abnormal gait, decreased activity tolerance, decreased balance, decreased endurance, decreased mobility, difficulty walking, decreased ROM, decreased strength, increased edema, increased fascial restrictions, impaired perceived functional ability, and pain.   ACTIVITY LIMITATIONS: carrying, lifting, bending, sitting, standing, squatting, sleeping, stairs, transfers, bed mobility, bathing, dressing, locomotion level, and caring for others  PARTICIPATION LIMITATIONS: meal prep, cleaning, laundry, shopping, community activity, occupation, and yard work  [facility] POTENTIAL: Good  CLINICAL DECISION MAKING: Stable/uncomplicated  EVALUATION COMPLEXITY: Low   GOALS: Goals reviewed with patient? No  SHORT TERM GOALS: Target date: 06/25/2022 patient will be independent with initial HEP  Baseline: Goal status: INITIAL  2.  Patient will improve right knee extension to -5 to improve ability to contract quad during stance phase of gait Baseline: -12 Goal status: INITIAL    LONG TERM GOALS: Target date: 07/02/2022  Patient will be independent in self management strategies to improve quality of life and functional outcomes.  Baseline:  Goal status: INITIAL  2.  Patient will improve LEFS  score by 10 points to demonstrate improved functional mobility Baseline: 43/80 Goal status: INITIAL  3.  Patient will increase  right leg MMTs to 5/5 without pain to promote return to ambulation community distances with minimal deviation.    Baseline: see above Goal status: INITIAL  4.  Patient will increase distance on to 300 ft  to demonstrate improved functional mobility walking household and community distances.   Baseline: 251 ft with SPC Goal status: INITIAL  5.  Patient will improve 5 times sit to stand score from 20.93 sec to 16 sec to demonstrate improved functional mobility and increased lower extremity strength.  Baseline:  Goal status: INITIAL  6.  Patient will increase right knee mobility to -2 to 120 to promote normal navigation of steps; step over step pattern   Baseline:  Goal status: INITIAL   PLAN:  PT FREQUENCY: 2x/week  PT DURATION: 3 weeks  PLANNED INTERVENTIONS: Therapeutic exercises, Therapeutic activity, Neuromuscular re-education, Balance training, Gait training, Patient/Family education, Joint manipulation, Joint mobilization, Stair training, Orthotic/Fit training, DME instructions, Aquatic Therapy, Dry Needling, Electrical stimulation, Spinal manipulation, Spinal mobilization, Cryotherapy, Moist heat, Compression bandaging, scar mobilization, Splintting, Taping, Traction, Ultrasound, Ionotophoresis 4mg /ml Dexamethasone, and Manual therapy   PLAN FOR NEXT SESSION: Review of HEP and goals; progress knee mobility and strength as able; gait training and balance   3:02 PM, 06/11/22 Antasia Haider Small Pernie Grosso MPT [place] [facility] [place] [phone] Ph:[phone]

## 2022-06-11 ENCOUNTER — Other Ambulatory Visit: Payer: Self-pay

## 2022-06-11 ENCOUNTER — Ambulatory Visit (HOSPITAL_COMMUNITY): Payer: Medicaid Other | Attending: Orthopedic Surgery

## 2022-06-11 DIAGNOSIS — M25661 Stiffness of right knee, not elsewhere classified: Secondary | ICD-10-CM | POA: Insufficient documentation

## 2022-06-11 DIAGNOSIS — R262 Difficulty in walking, not elsewhere classified: Secondary | ICD-10-CM | POA: Insufficient documentation

## 2022-06-11 DIAGNOSIS — M1711 Unilateral primary osteoarthritis, right knee: Secondary | ICD-10-CM | POA: Insufficient documentation

## 2022-06-13 ENCOUNTER — Encounter: Payer: Self-pay | Admitting: Orthopedic Surgery

## 2022-06-13 ENCOUNTER — Ambulatory Visit (INDEPENDENT_AMBULATORY_CARE_PROVIDER_SITE_OTHER): Payer: Medicaid Other | Admitting: Orthopedic Surgery

## 2022-06-13 ENCOUNTER — Encounter (HOSPITAL_COMMUNITY): Payer: Medicaid Other

## 2022-06-13 DIAGNOSIS — Z96651 Presence of right artificial knee joint: Secondary | ICD-10-CM

## 2022-06-13 MED ORDER — HYDROCODONE-ACETAMINOPHEN 5-325 MG PO TABS: 1.0000 | ORAL_TABLET | Freq: Three times a day (TID) | ORAL | 0 refills | Status: AC | PRN

## 2022-06-13 NOTE — Patient Instructions (Signed)
Wait 1 week before getting in the hot tub of the pool   Give her a return to work note she will give you the details

## 2022-06-13 NOTE — Progress Notes (Signed)
   This is a postop visit  Encounter Diagnosis  Name Primary?   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 Yes    Chief Complaint  Patient presents with   Post-op Follow-up    Total knee right/ wants to return to work 2 hour shifts light duty and wants to know if ok to get in pool and hot tub 05/09/22 date of surgery      The patient is status post right total knee  This is postop day number 35-week #5  Postop pain control hydrocodone 5 mg every 6 as needed  Subjective complaints no complaints  Physical exam findings full extension the range of motion exam, flexions 115 gait is independent  Assessment and plan  Return to work modified duty come back in 6 weeks  Continue opioid taper  Meds ordered this encounter  Medications   HYDROcodone-acetaminophen (NORCO/VICODIN) 5-325 MG tablet    Sig: Take 1 tablet by mouth every 8 (eight) hours as needed for up to 5 days for severe pain.    Dispense:  15 tablet    Refill:  0

## 2022-06-18 ENCOUNTER — Encounter (HOSPITAL_COMMUNITY): Payer: Medicaid Other | Admitting: Physical Therapy

## 2022-06-19 ENCOUNTER — Other Ambulatory Visit: Payer: Self-pay

## 2022-06-19 ENCOUNTER — Encounter: Payer: Self-pay | Admitting: Orthopedic Surgery

## 2022-06-19 ENCOUNTER — Ambulatory Visit (INDEPENDENT_AMBULATORY_CARE_PROVIDER_SITE_OTHER): Payer: Medicaid Other | Admitting: Orthopedic Surgery

## 2022-06-19 DIAGNOSIS — Z96651 Presence of right artificial knee joint: Secondary | ICD-10-CM

## 2022-06-19 DIAGNOSIS — M1711 Unilateral primary osteoarthritis, right knee: Secondary | ICD-10-CM

## 2022-06-19 DIAGNOSIS — S8001XA Contusion of right knee, initial encounter: Secondary | ICD-10-CM

## 2022-06-19 NOTE — Progress Notes (Signed)
   This is a postop visit  Encounter Diagnoses  Name Primary?   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 Yes   Primary osteoarthritis of right knee     Chief Complaint  Patient presents with   Post-op Follow-up    Right knee 05/09/22 fell 06/17/22   X-rays were obtained prior to examination no fracture or dislocation seen no implant loosening noted  Taylor Ingram is ambulatory no assistive devices she might have a little bit of a limp that she did have last time I saw her she can bend and straighten the knee the extensor mechanism is intact  She had a posterior sacrificing knee so there is no ligament that she could have torn  There is tenderness over the front of the knee near the tibial tubercle and patellar tendon  The wound is still clean dry and intact  Recommend she continue physical therapy  Ice  Rest  Keep prior appointment expect 2 to 4-week.  Until this resolves  Encounter Diagnoses  Name Primary?   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 Yes   Primary osteoarthritis of right knee    Contusion of right knee, initial encounter

## 2022-06-19 NOTE — Patient Instructions (Signed)
Ice as needed  Rest  Continue physical therapy  Keep prior appointment  Diagnosis bone contusion

## 2022-06-20 ENCOUNTER — Encounter (HOSPITAL_COMMUNITY): Payer: Self-pay

## 2022-06-20 ENCOUNTER — Other Ambulatory Visit: Payer: Self-pay | Admitting: Orthopedic Surgery

## 2022-06-20 ENCOUNTER — Ambulatory Visit (HOSPITAL_COMMUNITY): Payer: Medicaid Other | Attending: Orthopedic Surgery

## 2022-06-20 DIAGNOSIS — R262 Difficulty in walking, not elsewhere classified: Secondary | ICD-10-CM | POA: Insufficient documentation

## 2022-06-20 DIAGNOSIS — M1711 Unilateral primary osteoarthritis, right knee: Secondary | ICD-10-CM | POA: Insufficient documentation

## 2022-06-20 DIAGNOSIS — M25661 Stiffness of right knee, not elsewhere classified: Secondary | ICD-10-CM | POA: Insufficient documentation

## 2022-06-20 MED ORDER — HYDROCODONE-ACETAMINOPHEN 5-325 MG PO TABS: 1.0000 | ORAL_TABLET | Freq: Four times a day (QID) | ORAL | 0 refills | Status: DC | PRN

## 2022-06-20 NOTE — Telephone Encounter (Signed)
Dr. Mort Sawyers pt - pt had an appointment yesterday and forgot to request a refill.  She is requesting a refill on Hydrocodone 5-325, 15 quantity, every 8 hours PRN for severe pain to be sent to [facility] in [place]

## 2022-06-20 NOTE — Therapy (Signed)
OUTPATIENT PHYSICAL THERAPY LOWER EXTREMITY TREATMENT   Patient Name: Taylor Ingram MRN: [id number] DOB:1965/03/15, 57 y.o., female Today's Date: 06/20/2022  END OF SESSION:  PT End of Session - 06/20/22 1350     Visit Number 2    Number of Visits 6    Date for PT Re-Evaluation 07/02/22    Authorization Type [place] Medicaid [employer];    Authorization Time Period 8 visits from 4/24-->08/10/22    PT Start Time 1303    PT Stop Time 1345    PT Time Calculation (min) 42 min    Activity Tolerance Patient tolerated treatment well    Behavior During Therapy WFL for tasks assessed/performed              Past Medical History:  Diagnosis Date   Anxiety    Gout    Hypertension    Panic attack    PONV (postoperative nausea and vomiting)    Pre-diabetes    Past Surgical History:  Procedure Laterality Date   KNEE ARTHROSCOPY     MENISCUS REPAIR     MENISECTOMY     ORTHOPEDIC SURGERY     TOTAL KNEE ARTHROPLASTY Right 05/09/2022   Procedure: TOTAL KNEE ARTHROPLASTY;  Surgeon: Vickki Hearing, MD;  Location: [facility];  Service: Orthopedics;  Laterality: Right;   Patient Active Problem List   Diagnosis Date Noted   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 05/21/2022   Primary osteoarthritis of right knee 05/09/2022   History of arthroscopy of knee 05/14/2017   Loose body in knee, right 04/29/2017   Primary localized osteoarthrosis of right lower leg 10/29/2016    PCP: [facility]  REFERRING PROVIDER: Vickki Hearing, MD  REFERRING DIAG: M17.11 (ICD-10-CM) - Primary localized osteoarthrosis of right lower leg  THERAPY DIAG:  Osteoarthritis of right knee, unspecified osteoarthritis type  Difficulty in walking, not elsewhere classified  Stiffness of right knee, not elsewhere classified  Rationale for Evaluation and Treatment: Rehabilitation  ONSET DATE: 05/09/2022  SUBJECTIVE:   SUBJECTIVE STATEMENT: 06/20/22:  Reports a fall out of the  shower, stated she landed on her hands.  Saw MD Romeo Apple yesterday with xrays taken and no injury to knee.  Reports she has been compliant with HEP.  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  Noticed she has increased swelling ankle and knee.    Eval:  Ongoing knee pain for years; since 2009; multiple shots and 3 arthroscopic surgeries; finally had TKA per Romeo Apple.  Did not have home health; had CPM and a floor bike  PERTINENT HISTORY: na PAIN:  Are you having pain? Yes: NPRS scale: 5/10 Pain location: right knee Pain description: tight Aggravating factors: movement Relieving factors: pain medication  PRECAUTIONS: None  WEIGHT BEARING RESTRICTIONS: No  FALLS:  Has patient fallen in last 6 months? No  LIVING ENVIRONMENT: Lives with: lives alone Lives in: House/apartment Stairs: Yes: External: 4 steps; none Has following equipment at home: Single point cane  OCCUPATION: culinary school  PLOF: Independent  PATIENT GOALS: get it normal  NEXT MD VISIT: 06/13/2022  OBJECTIVE:   DIAGNOSTIC FINDINGS:  Narrative & Impression  CLINICAL DATA:  Status post right total knee replacement.   EXAM: PORTABLE RIGHT KNEE - 1-2 VIEW   COMPARISON:  April 07, 2022.   FINDINGS: The right femoral and tibial components are well situated. Expected postoperative changes are noted in the soft tissues anteriorly.   IMPRESSION:  Status post right total knee arthroplasty.       PATIENT SURVEYS:  LEFS 43/80 53.8%  COGNITION: Overall cognitive status: Within functional limits for tasks assessed     SENSATION: WFL  EDEMA:  Normal for this time s/p  PALPATION:  Area lower min incision tender and red to touch; increased scabbing  LOWER EXTREMITY ROM:  Active ROM Right eval Left eval Right 06/20/22  Hip flexion     Hip extension     Hip abduction     Hip adduction     Hip internal rotation     Hip external rotation      Knee flexion 112 123 112  Knee extension -12 -5 9 lacking  Ankle dorsiflexion     Ankle plantarflexion     Ankle inversion     Ankle eversion      (Blank rows = not tested)  LOWER EXTREMITY MMT:  MMT Right eval Left eval  Hip flexion 4 5  Hip extension    Hip abduction    Hip adduction    Hip internal rotation    Hip external rotation    Knee flexion    Knee extension 4+ 5  Ankle dorsiflexion 4+ 5  Ankle plantarflexion    Ankle inversion    Ankle eversion     (Blank rows = not tested)  FUNCTIONAL TESTS:  5 times sit to stand: 20.93 sec 2 minute walk test: 251 ft with SPC  GAIT: Distance walked: 251 ft Assistive device utilized: Single point cane Level of assistance: Modified independence Comments: antalgic gait; slow gait speed   TODAY'S TREATMENT:                                                                                                                              DATE:  06/20/22:   Reviewed goals Reviewed importance of HEP compliance Discussed compression garments benefits for edema control  Supine: Quad sets 10x 5" AROM 9-112 Bridge 10x  Standing:  STS 10x eccentric control Squat with HHA TKE yellow band 10x 5" Knee drive 62ZH step 5x 10" for flexion Slant board 2x 30"  Seated: LAQ15x Bike seat 11 x5' full revoution  06/11/22 physical therapy evaluation and HEP instruction    PATIENT EDUCATION:  Education details: Patient educated on exam findings, POC, scope of PT, HEP, and what to expect next treatment. Person educated: Patient Education method: Explanation, Demonstration, and Handouts Education comprehension: verbalized understanding, returned demonstration, verbal cues required, and tactile cues required  HOME EXERCISE PROGRAM: Access Code: [email or web address] URL: [email or web address] Date: 06/11/2022 Prepared by: [facility]  Exercises - Supine Ankle Pumps  - 2 x daily - 7 x weekly - 1 sets - 15 reps - Supine Quadricep Sets   - 2 x daily - 7 x weekly - 1 sets - 15 reps - 5 sec hold - Supine Active Straight Leg Raise  - 2 x daily - 7 x weekly - 1  sets - 15 reps - Supine Short Arc Quad  - 2 x daily - 7 x weekly - 1 sets - 15 reps - Supine Heel Slides  - 2 x daily - 7 x weekly - 1 sets - 15 reps - Seated Long Arc Quad  - 2 x daily - 7 x weekly - 1 sets - 15 reps - Seated Knee Flexion Stretch  - 2 x daily - 7 x weekly - 1 sets - 15 reps - Heel Toe Raises with Counter Support  - 2 x daily - 7 x weekly - 1 sets - 10 reps - Mini Squat with Counter Support  - 2 x daily - 7 x weekly - 1 sets - 10 reps - Standing Hip Extension with Counter Support  - 2 x daily - 7 x weekly - 1 sets - 10 reps - Standing Hip Abduction with Counter Support  - 2 x daily - 7 x weekly - 1 sets - 10 reps  -06/20/22:   Bridge  ASSESSMENT:  CLINICAL IMPRESSION: 06/20/22:  Reviewed goals and educated importance of HEP compliance for maximal benefits with therapy.  Pt able to recall and demonstrate appropriate mechanics with current HEP.  Session focus with knee mobility and proximal strengthening.  Pt stated she has returned to work and will increase 1 hr each week, stated she noticed increased swelling in knee and ankle.  Pt educated on benefits of compression garments for standing up to 8 hr days with RTW.  Pt stated she was not a fan of TED hose and did not wish to be measured or receive information on garments.  Will continue to follow this as may be needed with RTW.  Pt able to complete all exercises with good form and mechanics following initial instruction.  No reports of pain through session.    Eval:  Patient is a 57 y.o. female who was seen today for physical therapy evaluation and treatment for M17.11 (ICD-10-CM) - Primary localized osteoarthrosis of right lower leg. Patient  presents to physical therapy with complaint of Right knee pain and stiffness. Patient demonstrates muscle weakness, reduced ROM, and fascial restrictions which are likely  contributing to symptoms of pain and are negatively impacting patient ability to perform ADLs and functional mobility tasks. Patient will benefit from skilled physical therapy services to address these deficits to reduce pain and improve level of function with ADLs and functional mobility tasks.   OBJECTIVE IMPAIRMENTS: Abnormal gait, decreased activity tolerance, decreased balance, decreased endurance, decreased mobility, difficulty walking, decreased ROM, decreased strength, increased edema, increased fascial restrictions, impaired perceived functional ability, and pain.   ACTIVITY LIMITATIONS: carrying, lifting, bending, sitting, standing, squatting, sleeping, stairs, transfers, bed mobility, bathing, dressing, locomotion level, and caring for others  PARTICIPATION LIMITATIONS: meal prep, cleaning, laundry, shopping, community activity, occupation, and yard work  [facility] POTENTIAL: Good  CLINICAL DECISION MAKING: Stable/uncomplicated  EVALUATION COMPLEXITY: Low   GOALS: Goals reviewed with patient? No  SHORT TERM GOALS: Target date: 06/25/2022 patient will be independent with initial HEP  Baseline: Goal status: IN PROGRESS  2.  Patient will improve right knee extension to -5 to improve ability to contract quad during stance phase of gait Baseline: -12 Goal status: IN PROGRESS    LONG TERM GOALS: Target date: 07/02/2022  Patient will be independent in self management strategies to improve quality of life and functional outcomes.  Baseline:  Goal status: IN PROGRESS  2.  Patient will improve LEFS score by 10 points to  demonstrate improved functional mobility Baseline: 43/80 Goal status: IN PROGRESS  3.  Patient will increase  right leg MMTs to 5/5 without pain to promote return to ambulation community distances with minimal deviation.    Baseline: see above Goal status: IN PROGRESS  4.  Patient will increase distance on to 300 ft  to demonstrate improved functional  mobility walking household and community distances.   Baseline: 251 ft with SPC Goal status: IN PROGRESS  5.  Patient will improve 5 times sit to stand score from 20.93 sec to 16 sec to demonstrate improved functional mobility and increased lower extremity strength.  Baseline:  Goal status: IN PROGRESS  6.  Patient will increase right knee mobility to -2 to 120 to promote normal navigation of steps; step over step pattern   Baseline:  Goal status: IN PROGRESS   PLAN:  PT FREQUENCY: 2x/week  PT DURATION: 3 weeks  PLANNED INTERVENTIONS: Therapeutic exercises, Therapeutic activity, Neuromuscular re-education, Balance training, Gait training, Patient/Family education, Joint manipulation, Joint mobilization, Stair training, Orthotic/Fit training, DME instructions, Aquatic Therapy, Dry Needling, Electrical stimulation, Spinal manipulation, Spinal mobilization, Cryotherapy, Moist heat, Compression bandaging, scar mobilization, Splintting, Taping, Traction, Ultrasound, Ionotophoresis 4mg /ml Dexamethasone, and Manual therapy   PLAN FOR NEXT SESSION: Progress knee mobility and strength as able; gait training and balance  Becky Sax, LPTA/CLT; [facility] [phone]  3:47 PM, 06/20/22

## 2022-06-24 ENCOUNTER — Encounter (HOSPITAL_COMMUNITY): Payer: Self-pay

## 2022-06-24 ENCOUNTER — Ambulatory Visit (HOSPITAL_COMMUNITY): Payer: Medicaid Other

## 2022-06-24 DIAGNOSIS — M1711 Unilateral primary osteoarthritis, right knee: Secondary | ICD-10-CM | POA: Diagnosis not present

## 2022-06-24 DIAGNOSIS — R262 Difficulty in walking, not elsewhere classified: Secondary | ICD-10-CM

## 2022-06-24 DIAGNOSIS — M25661 Stiffness of right knee, not elsewhere classified: Secondary | ICD-10-CM

## 2022-06-24 NOTE — Therapy (Signed)
OUTPATIENT PHYSICAL THERAPY LOWER EXTREMITY TREATMENT   Patient Name: Taylor Ingram MRN: [id number] DOB:09-Jul-1965, 57 y.o., female Today's Date: 06/24/2022  END OF SESSION:  PT End of Session - 06/24/22 1237     Visit Number 3    Number of Visits 6    Date for PT Re-Evaluation 07/02/22    Authorization Type [place] [employer];    Authorization Time Period 8 visits from 4/24-->08/10/22    PT Start Time 1133    PT Stop Time 1212    PT Time Calculation (min) 39 min    Activity Tolerance Patient tolerated treatment well    Behavior During Therapy WFL for tasks assessed/performed               Past Medical History:  Diagnosis Date   Anxiety    Gout    Hypertension    Panic attack    PONV (postoperative nausea and vomiting)    Pre-diabetes    Past Surgical History:  Procedure Laterality Date   KNEE ARTHROSCOPY     MENISCUS REPAIR     MENISECTOMY     ORTHOPEDIC SURGERY     TOTAL KNEE ARTHROPLASTY Right 05/09/2022   Procedure: TOTAL KNEE ARTHROPLASTY;  Surgeon: Vickki Hearing, MD;  Location: [facility];  Service: Orthopedics;  Laterality: Right;   Patient Active Problem List   Diagnosis Date Noted   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 05/21/2022   Primary osteoarthritis of right knee 05/09/2022   History of arthroscopy of knee 05/14/2017   Loose body in knee, right 04/29/2017   Primary localized osteoarthrosis of right lower leg 10/29/2016    PCP: [facility]  REFERRING PROVIDER: Vickki Hearing, MD  REFERRING DIAG: M17.11 (ICD-10-CM) - Primary localized osteoarthrosis of right lower leg  THERAPY DIAG:  Osteoarthritis of right knee, unspecified osteoarthritis type  Difficulty in walking, not elsewhere classified  Stiffness of right knee, not elsewhere classified  Rationale for Evaluation and Treatment: Rehabilitation  ONSET DATE: 05/09/2022  SUBJECTIVE:   SUBJECTIVE STATEMENT: 06/24/22:  Woke up at 4:00 this morning  for 3 hours of work.  No real pain just tightness proximal knee.  Stated she has intolerance for her pants or sheets to touch incision.  Eval:  Ongoing knee pain for years; since 2009; multiple shots and 3 arthroscopic surgeries; finally had TKA per Romeo Apple.  Did not have home health; had CPM and a floor bike  PERTINENT HISTORY: na PAIN:  Are you having pain? Yes: NPRS scale: 5/10 Pain location: right knee Pain description: tight Aggravating factors: movement Relieving factors: pain medication  PRECAUTIONS: None  WEIGHT BEARING RESTRICTIONS: No  FALLS:  Has patient fallen in last 6 months? No  LIVING ENVIRONMENT: Lives with: lives alone Lives in: House/apartment Stairs: Yes: External: 4 steps; none Has following equipment at home: Single point cane  OCCUPATION: culinary school  PLOF: Independent  PATIENT GOALS: get it normal  NEXT MD VISIT: 06/13/2022  OBJECTIVE:   DIAGNOSTIC FINDINGS:  Narrative & Impression  CLINICAL DATA:  Status post right total knee replacement.   EXAM: PORTABLE RIGHT KNEE - 1-2 VIEW   COMPARISON:  April 07, 2022.   FINDINGS: The right femoral and tibial components are well situated. Expected postoperative changes are noted in the soft tissues anteriorly.   IMPRESSION: Status post right total knee arthroplasty.       PATIENT SURVEYS:  LEFS 43/80 53.8%  COGNITION: Overall cognitive status: Within functional limits for tasks assessed  SENSATION: WFL  EDEMA:  Normal for this time s/p  PALPATION:  Area lower min incision tender and red to touch; increased scabbing  LOWER EXTREMITY ROM:  Active ROM Right eval Left eval Right 06/20/22 Right 06/24/22  Hip flexion      Hip extension      Hip abduction      Hip adduction      Hip internal rotation      Hip external rotation      Knee flexion 112 123 112 110  Knee extension -12 -5 9 lacking 3  Ankle dorsiflexion      Ankle plantarflexion      Ankle inversion       Ankle eversion       (Blank rows = not tested)  LOWER EXTREMITY MMT:  MMT Right eval Left eval  Hip flexion 4 5  Hip extension    Hip abduction    Hip adduction    Hip internal rotation    Hip external rotation    Knee flexion    Knee extension 4+ 5  Ankle dorsiflexion 4+ 5  Ankle plantarflexion    Ankle inversion    Ankle eversion     (Blank rows = not tested)  FUNCTIONAL TESTS:  5 times sit to stand: 20.93 sec 2 minute walk test: 251 ft with SPC  GAIT: Distance walked: 251 ft Assistive device utilized: Single point cane Level of assistance: Modified independence Comments: antalgic gait; slow gait speed   TODAY'S TREATMENT:                                                                                                                              DATE:  06/24/22: Desensitization techniques to improve tolerance with pants, short and sheets   Bike seat 11 x5' full revoution Standing:  TKE 2Pl 15x 5" Rockerboard lateral and DF/PF 2 min each Squats Slant board 2x 30"   Supine: TKE 15x 5" Hamstring stretch with rope 2x 30" AROM 3-110   06/20/22:   Reviewed goals Reviewed importance of HEP compliance Discussed compression garments benefits for edema control  Supine: Quad sets 10x 5" AROM 9-112 Bridge 10x  Standing:  STS 10x eccentric control Squat with HHA TKE yellow band 10x 5" Knee drive 45WU step 5x 10" for flexion Slant board 2x 30"  Seated: LAQ15x Bike seat 11 x5' full revoution  06/11/22 physical therapy evaluation and HEP instruction    PATIENT EDUCATION:  Education details: Patient educated on exam findings, POC, scope of PT, HEP, and what to expect next treatment. Person educated: Patient Education method: Explanation, Demonstration, and Handouts Education comprehension: verbalized understanding, returned demonstration, verbal cues required, and tactile cues required  HOME EXERCISE PROGRAM: Access Code: [email or web address] URL:  [email or web address] Date: 06/11/2022 Prepared by: [facility]  Exercises - Supine Ankle Pumps  - 2 x daily - 7 x weekly - 1 sets - 15 reps - Supine Quadricep Sets  -  2 x daily - 7 x weekly - 1 sets - 15 reps - 5 sec hold - Supine Active Straight Leg Raise  - 2 x daily - 7 x weekly - 1 sets - 15 reps - Supine Short Arc Quad  - 2 x daily - 7 x weekly - 1 sets - 15 reps - Supine Heel Slides  - 2 x daily - 7 x weekly - 1 sets - 15 reps - Seated Long Arc Quad  - 2 x daily - 7 x weekly - 1 sets - 15 reps - Seated Knee Flexion Stretch  - 2 x daily - 7 x weekly - 1 sets - 15 reps - Heel Toe Raises with Counter Support  - 2 x daily - 7 x weekly - 1 sets - 10 reps - Mini Squat with Counter Support  - 2 x daily - 7 x weekly - 1 sets - 10 reps - Standing Hip Extension with Counter Support  - 2 x daily - 7 x weekly - 1 sets - 10 reps - Standing Hip Abduction with Counter Support  - 2 x daily - 7 x weekly - 1 sets - 10 reps  -06/20/22:   Bridge     ASSESSMENT:  CLINICAL IMPRESSION: 06/24/22:  Educated desensitization techniques to improve tolerance with fabric on incision.  Rockerboard to equalize weight bearing during gait to address antalgic gait mechanics.  Continued with ROM based exercises and stretches for mobility.  No reports of pain through session.  AROM 3-110 degrees.  Eval:  Patient is a 57 y.o. female who was seen today for physical therapy evaluation and treatment for M17.11 (ICD-10-CM) - Primary localized osteoarthrosis of right lower leg. Patient  presents to physical therapy with complaint of Right knee pain and stiffness. Patient demonstrates muscle weakness, reduced ROM, and fascial restrictions which are likely contributing to symptoms of pain and are negatively impacting patient ability to perform ADLs and functional mobility tasks. Patient will benefit from skilled physical therapy services to address these deficits to reduce pain and improve level of function with ADLs  and functional mobility tasks.   OBJECTIVE IMPAIRMENTS: Abnormal gait, decreased activity tolerance, decreased balance, decreased endurance, decreased mobility, difficulty walking, decreased ROM, decreased strength, increased edema, increased fascial restrictions, impaired perceived functional ability, and pain.   ACTIVITY LIMITATIONS: carrying, lifting, bending, sitting, standing, squatting, sleeping, stairs, transfers, bed mobility, bathing, dressing, locomotion level, and caring for others  PARTICIPATION LIMITATIONS: meal prep, cleaning, laundry, shopping, community activity, occupation, and yard work  [facility] POTENTIAL: Good  CLINICAL DECISION MAKING: Stable/uncomplicated  EVALUATION COMPLEXITY: Low   GOALS: Goals reviewed with patient? No  SHORT TERM GOALS: Target date: 06/25/2022 patient will be independent with initial HEP  Baseline: Goal status: IN PROGRESS  2.  Patient will improve right knee extension to -5 to improve ability to contract quad during stance phase of gait Baseline: -12 Goal status: IN PROGRESS    LONG TERM GOALS: Target date: 07/02/2022  Patient will be independent in self management strategies to improve quality of life and functional outcomes.  Baseline:  Goal status: IN PROGRESS  2.  Patient will improve LEFS score by 10 points to demonstrate improved functional mobility Baseline: 43/80 Goal status: IN PROGRESS  3.  Patient will increase  right leg MMTs to 5/5 without pain to promote return to ambulation community distances with minimal deviation.    Baseline: see above Goal status: IN PROGRESS  4.  Patient will increase distance on  to 300 ft  to demonstrate improved functional mobility walking household and community distances.   Baseline: 251 ft with SPC Goal status: IN PROGRESS  5.  Patient will improve 5 times sit to stand score from 20.93 sec to 16 sec to demonstrate improved functional mobility and increased lower extremity  strength.  Baseline:  Goal status: IN PROGRESS  6.  Patient will increase right knee mobility to -2 to 120 to promote normal navigation of steps; step over step pattern   Baseline:  Goal status: IN PROGRESS   PLAN:  PT FREQUENCY: 2x/week  PT DURATION: 3 weeks  PLANNED INTERVENTIONS: Therapeutic exercises, Therapeutic activity, Neuromuscular re-education, Balance training, Gait training, Patient/Family education, Joint manipulation, Joint mobilization, Stair training, Orthotic/Fit training, DME instructions, Aquatic Therapy, Dry Needling, Electrical stimulation, Spinal manipulation, Spinal mobilization, Cryotherapy, Moist heat, Compression bandaging, scar mobilization, Splintting, Taping, Traction, Ultrasound, Ionotophoresis 4mg /ml Dexamethasone, and Manual therapy   PLAN FOR NEXT SESSION: Progress knee mobility and strength as able; gait training and balance  Becky Sax, LPTA/CLT; CBIS [phone]  12:38 PM, 06/24/22

## 2022-06-26 ENCOUNTER — Encounter (HOSPITAL_COMMUNITY): Payer: Medicaid Other

## 2022-06-27 ENCOUNTER — Other Ambulatory Visit: Payer: Self-pay | Admitting: Orthopedic Surgery

## 2022-06-27 NOTE — Telephone Encounter (Signed)
Dr. Mort Sawyers pt - pt lvm requesting a refill on Hydrocodone 5-325, 15 quantity, every 6 hours PRN for moderate pain to be sent to [facility] in [place]

## 2022-06-30 ENCOUNTER — Encounter (HOSPITAL_COMMUNITY): Payer: Self-pay

## 2022-06-30 ENCOUNTER — Other Ambulatory Visit: Payer: Self-pay | Admitting: Orthopedic Surgery

## 2022-06-30 ENCOUNTER — Ambulatory Visit (HOSPITAL_COMMUNITY): Payer: Medicaid Other

## 2022-06-30 DIAGNOSIS — M25661 Stiffness of right knee, not elsewhere classified: Secondary | ICD-10-CM

## 2022-06-30 DIAGNOSIS — R262 Difficulty in walking, not elsewhere classified: Secondary | ICD-10-CM

## 2022-06-30 DIAGNOSIS — M1711 Unilateral primary osteoarthritis, right knee: Secondary | ICD-10-CM | POA: Diagnosis not present

## 2022-06-30 NOTE — Therapy (Signed)
OUTPATIENT PHYSICAL THERAPY LOWER EXTREMITY TREATMENT   Patient Name: Taylor Ingram MRN: [id number] DOB:08-22-1965, 57 y.o., female Today's Date: 06/30/2022  END OF SESSION:  PT End of Session - 06/30/22 1213     Visit Number 4    Number of Visits 6    Date for PT Re-Evaluation 07/02/22    Authorization Type [place] [employer];    Authorization Time Period 8 visits from 4/24-->08/10/22    PT Start Time 1134    PT Stop Time 1215    PT Time Calculation (min) 41 min    Activity Tolerance Patient tolerated treatment well    Behavior During Therapy WFL for tasks assessed/performed                Past Medical History:  Diagnosis Date   Anxiety    Gout    Hypertension    Panic attack    PONV (postoperative nausea and vomiting)    Pre-diabetes    Past Surgical History:  Procedure Laterality Date   KNEE ARTHROSCOPY     MENISCUS REPAIR     MENISECTOMY     ORTHOPEDIC SURGERY     TOTAL KNEE ARTHROPLASTY Right 05/09/2022   Procedure: TOTAL KNEE ARTHROPLASTY;  Surgeon: Vickki Hearing, MD;  Location: [facility];  Service: Orthopedics;  Laterality: Right;   Patient Active Problem List   Diagnosis Date Noted   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 05/21/2022   Primary osteoarthritis of right knee 05/09/2022   History of arthroscopy of knee 05/14/2017   Loose body in knee, right 04/29/2017   Primary localized osteoarthrosis of right lower leg 10/29/2016    PCP: [facility]  REFERRING PROVIDER: Vickki Hearing, MD  REFERRING DIAG: M17.11 (ICD-10-CM) - Primary localized osteoarthrosis of right lower leg  THERAPY DIAG:  Osteoarthritis of right knee, unspecified osteoarthritis type  Difficulty in walking, not elsewhere classified  Stiffness of right knee, not elsewhere classified  Rationale for Evaluation and Treatment: Rehabilitation  ONSET DATE: 05/09/2022  SUBJECTIVE:   SUBJECTIVE STATEMENT: 06/30/22:  Knee is achey today.   Has began the desensitization techniques over weekend, continues to be irritated.  Incision is itchy today.  Worked for 4 hours this week.    Eval:  Ongoing knee pain for years; since 2009; multiple shots and 3 arthroscopic surgeries; finally had TKA per Romeo Apple.  Did not have home health; had CPM and a floor bike  PERTINENT HISTORY: na PAIN:  Are you having pain? Yes: NPRS scale: 5/10 Pain location: right knee Pain description: tight, achey Aggravating factors: movement Relieving factors: pain medication  PRECAUTIONS: None  WEIGHT BEARING RESTRICTIONS: No  FALLS:  Has patient fallen in last 6 months? No  LIVING ENVIRONMENT: Lives with: lives alone Lives in: House/apartment Stairs: Yes: External: 4 steps; none Has following equipment at home: Single point cane  OCCUPATION: culinary school  PLOF: Independent  PATIENT GOALS: get it normal  NEXT MD VISIT: 06/13/2022  OBJECTIVE:   DIAGNOSTIC FINDINGS:  Narrative & Impression  CLINICAL DATA:  Status post right total knee replacement.   EXAM: PORTABLE RIGHT KNEE - 1-2 VIEW   COMPARISON:  April 07, 2022.   FINDINGS: The right femoral and tibial components are well situated. Expected postoperative changes are noted in the soft tissues anteriorly.   IMPRESSION: Status post right total knee arthroplasty.       PATIENT SURVEYS:  LEFS 43/80 53.8%  COGNITION: Overall cognitive status: Within functional limits for tasks assessed  SENSATION: WFL  EDEMA:  Normal for this time s/p  PALPATION:  Area lower min incision tender and red to touch; increased scabbing  LOWER EXTREMITY ROM:  Active ROM Right eval Left eval Right 06/20/22 Right 06/24/22 Right 06/30/22:  Hip flexion       Hip extension       Hip abduction       Hip adduction       Hip internal rotation       Hip external rotation       Knee flexion 112 123 112 110 118  Knee extension -12 -5 9 lacking 3 2  Ankle dorsiflexion       Ankle  plantarflexion       Ankle inversion       Ankle eversion        (Blank rows = not tested)  LOWER EXTREMITY MMT:  MMT Right eval Left eval  Hip flexion 4 5  Hip extension    Hip abduction    Hip adduction    Hip internal rotation    Hip external rotation    Knee flexion    Knee extension 4+ 5  Ankle dorsiflexion 4+ 5  Ankle plantarflexion    Ankle inversion    Ankle eversion     (Blank rows = not tested)  FUNCTIONAL TESTS:  5 times sit to stand: 20.93 sec 2 minute walk test: 251 ft with SPC  GAIT: Distance walked: 251 ft Assistive device utilized: Single point cane Level of assistance: Modified independence Comments: antalgic gait; slow gait speed   TODAY'S TREATMENT:                                                                                                                              DATE:  06/30/22: Bike seat 11 x5' full revoution Standing: Knee drive 5x 10" on 16XW step for flexion Squat to heel raise 10x TKE 3Pl 15x 5" Slant board 2x 30" Lateral step up 4in 10x Forward step up 4 then 6in 10x   Leg press 3Pl 10x   Supine: Heel slide AROM 2-118 degrees  06/24/22: Desensitization techniques to improve tolerance with pants, short and sheets   Bike seat 11 x5' full revoution Standing:  TKE 2Pl 15x 5" Rockerboard lateral and DF/PF 2 min each Squats Slant board 2x 30"   Supine: TKE 15x 5" Hamstring stretch with rope 2x 30" AROM 3-110   06/20/22:   Reviewed goals Reviewed importance of HEP compliance Discussed compression garments benefits for edema control  Supine: Quad sets 10x 5" AROM 9-112 Bridge 10x  Standing:  STS 10x eccentric control Squat with HHA TKE yellow band 10x 5" Knee drive 96EA step 5x 10" for flexion Slant board 2x 30"  Seated: LAQ15x Bike seat 11 x5' full revoution  06/11/22 physical therapy evaluation and HEP instruction    PATIENT EDUCATION:  Education details: Patient educated on exam findings, POC, scope of  PT, HEP,  and what to expect next treatment. Person educated: Patient Education method: Explanation, Demonstration, and Handouts Education comprehension: verbalized understanding, returned demonstration, verbal cues required, and tactile cues required  HOME EXERCISE PROGRAM: Access Code: [email or web address] URL: [email or web address] Date: 06/11/2022 Prepared by: [facility]  Exercises - Supine Ankle Pumps  - 2 x daily - 7 x weekly - 1 sets - 15 reps - Supine Quadricep Sets  - 2 x daily - 7 x weekly - 1 sets - 15 reps - 5 sec hold - Supine Active Straight Leg Raise  - 2 x daily - 7 x weekly - 1 sets - 15 reps - Supine Short Arc Quad  - 2 x daily - 7 x weekly - 1 sets - 15 reps - Supine Heel Slides  - 2 x daily - 7 x weekly - 1 sets - 15 reps - Seated Long Arc Quad  - 2 x daily - 7 x weekly - 1 sets - 15 reps - Seated Knee Flexion Stretch  - 2 x daily - 7 x weekly - 1 sets - 15 reps - Heel Toe Raises with Counter Support  - 2 x daily - 7 x weekly - 1 sets - 10 reps - Mini Squat with Counter Support  - 2 x daily - 7 x weekly - 1 sets - 10 reps - Standing Hip Extension with Counter Support  - 2 x daily - 7 x weekly - 1 sets - 10 reps - Standing Hip Abduction with Counter Support  - 2 x daily - 7 x weekly - 1 sets - 10 reps  -06/20/22:   Bridge     ASSESSMENT:  CLINICAL IMPRESSION: 06/30/22:  Added knee drives to address end range flexion and progressed functional strengthening with additional step up training.  Pt tolerated well to session with good control with new exercises and no reports of increased pain through session.  Pt also presents with improved gait mechanics with no antalgic gait mechanics.  Improved AROM 2-118 degrees.  Eval:  Patient is a 57 y.o. female who was seen today for physical therapy evaluation and treatment for M17.11 (ICD-10-CM) - Primary localized osteoarthrosis of right lower leg. Patient  presents to physical therapy with complaint of Right knee pain and  stiffness. Patient demonstrates muscle weakness, reduced ROM, and fascial restrictions which are likely contributing to symptoms of pain and are negatively impacting patient ability to perform ADLs and functional mobility tasks. Patient will benefit from skilled physical therapy services to address these deficits to reduce pain and improve level of function with ADLs and functional mobility tasks.   OBJECTIVE IMPAIRMENTS: Abnormal gait, decreased activity tolerance, decreased balance, decreased endurance, decreased mobility, difficulty walking, decreased ROM, decreased strength, increased edema, increased fascial restrictions, impaired perceived functional ability, and pain.   ACTIVITY LIMITATIONS: carrying, lifting, bending, sitting, standing, squatting, sleeping, stairs, transfers, bed mobility, bathing, dressing, locomotion level, and caring for others  PARTICIPATION LIMITATIONS: meal prep, cleaning, laundry, shopping, community activity, occupation, and yard work  [facility] POTENTIAL: Good  CLINICAL DECISION MAKING: Stable/uncomplicated  EVALUATION COMPLEXITY: Low   GOALS: Goals reviewed with patient? No  SHORT TERM GOALS: Target date: 06/25/2022 patient will be independent with initial HEP  Baseline: Goal status: IN PROGRESS  2.  Patient will improve right knee extension to -5 to improve ability to contract quad during stance phase of gait Baseline: -12 Goal status: IN PROGRESS    LONG TERM GOALS: Target date: 07/02/2022  Patient will be independent in self  management strategies to improve quality of life and functional outcomes.  Baseline:  Goal status: IN PROGRESS  2.  Patient will improve LEFS score by 10 points to demonstrate improved functional mobility Baseline: 43/80 Goal status: IN PROGRESS  3.  Patient will increase  right leg MMTs to 5/5 without pain to promote return to ambulation community distances with minimal deviation.    Baseline: see above Goal status:  IN PROGRESS  4.  Patient will increase distance on to 300 ft  to demonstrate improved functional mobility walking household and community distances.   Baseline: 251 ft with SPC Goal status: IN PROGRESS  5.  Patient will improve 5 times sit to stand score from 20.93 sec to 16 sec to demonstrate improved functional mobility and increased lower extremity strength.  Baseline:  Goal status: IN PROGRESS  6.  Patient will increase right knee mobility to -2 to 120 to promote normal navigation of steps; step over step pattern   Baseline:  Goal status: IN PROGRESS   PLAN:  PT FREQUENCY: 2x/week  PT DURATION: 3 weeks  PLANNED INTERVENTIONS: Therapeutic exercises, Therapeutic activity, Neuromuscular re-education, Balance training, Gait training, Patient/Family education, Joint manipulation, Joint mobilization, Stair training, Orthotic/Fit training, DME instructions, Aquatic Therapy, Dry Needling, Electrical stimulation, Spinal manipulation, Spinal mobilization, Cryotherapy, Moist heat, Compression bandaging, scar mobilization, Splintting, Taping, Traction, Ultrasound, Ionotophoresis 4mg /ml Dexamethasone, and Manual therapy   PLAN FOR NEXT SESSION: Progress knee mobility and strength as able; gait training and balance  Becky Sax, LPTA/CLT; CBIS [phone]  12:22 PM, 06/30/22

## 2022-06-30 NOTE — Telephone Encounter (Signed)
Dr. Mort Sawyers pt - spoke w/the patient, she's requesting a refill on Hydrocodone 5-325, 15 quantity, every 6 hours PRN for moderate pain to be sent to [facility]

## 2022-07-02 ENCOUNTER — Telehealth: Payer: Self-pay | Admitting: Orthopedic Surgery

## 2022-07-02 NOTE — Telephone Encounter (Signed)
Dr. Mort Sawyers pt - spoke w/the patient, she is requesting a refill on Hydrocodone 5-325, 15 quantity, every 6 hours PRN for moderate pain to be sent to [facility]

## 2022-07-03 ENCOUNTER — Ambulatory Visit (HOSPITAL_COMMUNITY): Payer: Medicaid Other

## 2022-07-03 ENCOUNTER — Other Ambulatory Visit: Payer: Self-pay | Admitting: Orthopedic Surgery

## 2022-07-03 DIAGNOSIS — M25561 Pain in right knee: Secondary | ICD-10-CM

## 2022-07-03 DIAGNOSIS — M1711 Unilateral primary osteoarthritis, right knee: Secondary | ICD-10-CM

## 2022-07-03 DIAGNOSIS — R262 Difficulty in walking, not elsewhere classified: Secondary | ICD-10-CM

## 2022-07-03 DIAGNOSIS — M25661 Stiffness of right knee, not elsewhere classified: Secondary | ICD-10-CM

## 2022-07-03 MED ORDER — HYDROCODONE-ACETAMINOPHEN 5-325 MG PO TABS: 1.0000 | ORAL_TABLET | Freq: Three times a day (TID) | ORAL | 0 refills | Status: AC | PRN

## 2022-07-03 NOTE — Therapy (Signed)
OUTPATIENT PHYSICAL THERAPY LOWER EXTREMITY TREATMENT/PROGRESS NOTE Progress Note Reporting Period 06/11/2022 to 07/03/2022  See note below for Objective Data and Assessment of Progress/Goals.       Patient Name: Taylor Ingram MRN: [id number] DOB:Dec 23, 1965, 57 y.o., female Today's Date: 07/03/2022  END OF SESSION:  PT End of Session - 07/03/22 1032     Visit Number 5    Number of Visits 6    Date for PT Re-Evaluation 07/02/22    Authorization Type [place] [employer];    Authorization Time Period 8 visits from 4/24-->08/10/22    Authorization - Visit Number 4    Authorization - Number of Visits 8    Progress Note Due on Visit 8    PT Start Time 1031    PT Stop Time 1113    PT Time Calculation (min) 42 min    Activity Tolerance Patient tolerated treatment well    Behavior During Therapy WFL for tasks assessed/performed                Past Medical History:  Diagnosis Date   Anxiety    Gout    Hypertension    Panic attack    PONV (postoperative nausea and vomiting)    Pre-diabetes    Past Surgical History:  Procedure Laterality Date   KNEE ARTHROSCOPY     MENISCUS REPAIR     MENISECTOMY     ORTHOPEDIC SURGERY     TOTAL KNEE ARTHROPLASTY Right 05/09/2022   Procedure: TOTAL KNEE ARTHROPLASTY;  Surgeon: Vickki Hearing, MD;  Location: [facility];  Service: Orthopedics;  Laterality: Right;   Patient Active Problem List   Diagnosis Date Noted   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 05/21/2022   Primary osteoarthritis of right knee 05/09/2022   History of arthroscopy of knee 05/14/2017   Loose body in knee, right 04/29/2017   Primary localized osteoarthrosis of right lower leg 10/29/2016    [facility]P: [facility]  REFERRING PROVIDER: Vickki Hearing, MD  REFERRING DIAG: M17.11 (ICD-10-CM) - Primary localized osteoarthrosis of right lower leg  THERAPY DIAG:  Osteoarthritis of right knee, unspecified osteoarthritis type - Plan:  PT plan of care cert/re-cert  Difficulty in walking, not elsewhere classified - Plan: PT plan of care cert/re-cert  Stiffness of right knee, not elsewhere classified - Plan: PT plan of care cert/re-cert  Rationale for Evaluation and Treatment: Rehabilitation  ONSET DATE: 05/09/2022  SUBJECTIVE:   SUBJECTIVE STATEMENT: 06/30/22:  Knee is achey today.  Has began the desensitization techniques over weekend, continues to be irritated.  Incision is itchy today.  Worked for 4 hours this week.    Eval:  Ongoing knee pain for years; since 2009; multiple shots and 3 arthroscopic surgeries; finally had TKA per Romeo Apple.  Did not have home health; had CPM and a floor bike  PERTINENT HISTORY: na PAIN:  Are you having pain? Yes: NPRS scale: 5/10 Pain location: right knee Pain description: tight, achey Aggravating factors: movement Relieving factors: pain medication  PRECAUTIONS: None  WEIGHT BEARING RESTRICTIONS: No  FALLS:  Has patient fallen in last 6 months? No  LIVING ENVIRONMENT: Lives with: lives alone Lives in: House/apartment Stairs: Yes: External: 4 steps; none Has following equipment at home: Single point cane  OCCUPATION: culinary school  PLOF: Independent  PATIENT GOALS: get it normal  NEXT MD VISIT: 06/13/2022  OBJECTIVE:   DIAGNOSTIC FINDINGS:  Narrative & Impression  CLINICAL DATA:  Status post right total knee replacement.  EXAM: PORTABLE RIGHT KNEE - 1-2 VIEW   COMPARISON:  April 07, 2022.   FINDINGS: The right femoral and tibial components are well situated. Expected postoperative changes are noted in the soft tissues anteriorly.   IMPRESSION: Status post right total knee arthroplasty.       PATIENT SURVEYS:  LEFS 43/80 53.8%  COGNITION: Overall cognitive status: Within functional limits for tasks assessed     SENSATION: WFL  EDEMA:  Normal for this time s/p  PALPATION:  Area lower min incision tender and red to touch; increased  scabbing  LOWER EXTREMITY ROM:  Active ROM Right eval Left eval Right 06/20/22 Right 06/24/22 Right 06/30/22: Right 07/03/22  Hip flexion        Hip extension        Hip abduction        Hip adduction        Hip internal rotation        Hip external rotation        Knee flexion 112 123 112 110 118 120  Knee extension -12 -5 9 lacking 3 2 2   Ankle dorsiflexion        Ankle plantarflexion        Ankle inversion        Ankle eversion         (Blank rows = not tested)  LOWER EXTREMITY MMT:  MMT Right eval Left eval Right 07/03/22  Hip flexion 4 5 5   Hip extension     Hip abduction     Hip adduction     Hip internal rotation     Hip external rotation     Knee flexion (sitting)   5  Knee extension 4+ 5 5  Ankle dorsiflexion 4+ 5 5  Ankle plantarflexion     Ankle inversion     Ankle eversion      (Blank rows = not tested)  FUNCTIONAL TESTS:  5 times sit to stand: 20.93 sec 2 minute walk test: 251 ft with SPC  GAIT: Distance walked: 251 ft Assistive device utilized: Single point cane Level of assistance: Modified independence Comments: antalgic gait; slow gait speed   TODAY'S TREATMENT:                                                                                                                              DATE:  07/03/22 Bike seat 14 x 5'  Progress note 5 times sit to stand 12.16 sec 2 MWT  412 ft without AD MMTs and AROM see above LEFS 49/80 61.3 % (goal 53) SLS right 12 sec; left 30 sec  Standing: Alternating lunges 6" box x 10 each Tandem stance on foam beam x 1' each  06/30/22: Bike seat 11 x5' full revoution Standing: Knee drive 5x 10" on 16XW step for flexion Squat to heel raise 10x TKE 3Pl 15x 5" Slant board 2x 30" Lateral step up 4in 10x Forward step up 4 then  6in 10x   Leg press 3Pl 10x   Supine: Heel slide AROM 2-118 degrees  06/24/22: Desensitization techniques to improve tolerance with pants, short and sheets   Bike seat 11 x5'  full revoution Standing:  TKE 2Pl 15x 5" Rockerboard lateral and DF/PF 2 min each Squats Slant board 2x 30"   Supine: TKE 15x 5" Hamstring stretch with rope 2x 30" AROM 3-110   06/20/22:   Reviewed goals Reviewed importance of HEP compliance Discussed compression garments benefits for edema control  Supine: Quad sets 10x 5" AROM 9-112 Bridge 10x  Standing:  STS 10x eccentric control Squat with HHA TKE yellow band 10x 5" Knee drive 40JW step 5x 10" for flexion Slant board 2x 30"  Seated: LAQ15x Bike seat 11 x5' full revoution  06/11/22 physical therapy evaluation and HEP instruction    PATIENT EDUCATION:  Education details: Patient educated on exam findings, POC, scope of PT, HEP, and what to expect next treatment. Person educated: Patient Education method: Explanation, Demonstration, and Handouts Education comprehension: verbalized understanding, returned demonstration, verbal cues required, and tactile cues required  HOME EXERCISE PROGRAM: Access Code: [email or web address] URL: [email or web address] Date: 06/11/2022 Prepared by: [facility]  Exercises - Supine Ankle Pumps  - 2 x daily - 7 x weekly - 1 sets - 15 reps - Supine Quadricep Sets  - 2 x daily - 7 x weekly - 1 sets - 15 reps - 5 sec hold - Supine Active Straight Leg Raise  - 2 x daily - 7 x weekly - 1 sets - 15 reps - Supine Short Arc Quad  - 2 x daily - 7 x weekly - 1 sets - 15 reps - Supine Heel Slides  - 2 x daily - 7 x weekly - 1 sets - 15 reps - Seated Long Arc Quad  - 2 x daily - 7 x weekly - 1 sets - 15 reps - Seated Knee Flexion Stretch  - 2 x daily - 7 x weekly - 1 sets - 15 reps - Heel Toe Raises with Counter Support  - 2 x daily - 7 x weekly - 1 sets - 10 reps - Mini Squat with Counter Support  - 2 x daily - 7 x weekly - 1 sets - 10 reps - Standing Hip Extension with Counter Support  - 2 x daily - 7 x weekly - 1 sets - 10 reps - Standing Hip Abduction with Counter Support  - 2 x daily - 7  x weekly - 1 sets - 10 reps  -06/20/22:   Bridge     ASSESSMENT:  CLINICAL IMPRESSION: Progress note today; good improvement; met all goals but LEFS; added new goal to address balance and will benefit from continued therapy 2 week 3 to address remaining unmet and partially met goals.  Eval:  Patient is a 57 y.o. female who was seen today for physical therapy evaluation and treatment for M17.11 (ICD-10-CM) - Primary localized osteoarthrosis of right lower leg. Patient  presents to physical therapy with complaint of Right knee pain and stiffness. Patient demonstrates muscle weakness, reduced ROM, and fascial restrictions which are likely contributing to symptoms of pain and are negatively impacting patient ability to perform ADLs and functional mobility tasks. Patient will benefit from skilled physical therapy services to address these deficits to reduce pain and improve level of function with ADLs and functional mobility tasks.   OBJECTIVE IMPAIRMENTS: Abnormal gait, decreased activity tolerance, decreased balance, decreased endurance, decreased mobility, difficulty walking, decreased  ROM, decreased strength, increased edema, increased fascial restrictions, impaired perceived functional ability, and pain.   ACTIVITY LIMITATIONS: carrying, lifting, bending, sitting, standing, squatting, sleeping, stairs, transfers, bed mobility, bathing, dressing, locomotion level, and caring for others  PARTICIPATION LIMITATIONS: meal prep, cleaning, laundry, shopping, community activity, occupation, and yard work  [facility] POTENTIAL: Good  CLINICAL DECISION MAKING: Stable/uncomplicated  EVALUATION COMPLEXITY: Low   GOALS: Goals reviewed with patient? No  SHORT TERM GOALS: Target date: 06/25/2022 patient will be independent with initial HEP  Baseline: Goal status: MET  2.  Patient will improve right knee extension to -5 to improve ability to contract quad during stance phase of gait Baseline:  -12 Goal status: MET    LONG TERM GOALS: Target date: 07/02/2022  Patient will be independent in self management strategies to improve quality of life and functional outcomes.  Baseline:  Goal status: MET  2.  Patient will improve LEFS score by 10 points to demonstrate improved functional mobility Baseline: 43/80; 49/80 07/03/22 Goal status: IN PROGRESS  3.  Patient will increase  right leg MMTs to 5/5 without pain to promote return to ambulation community distances with minimal deviation.    Baseline: see above Goal status: MET  4.  Patient will increase distance on to 300 ft  to demonstrate improved functional mobility walking household and community distances.   Baseline: 251 ft with SPC; 412 ft without AD Goal status: MET  5.  Patient will improve 5 times sit to stand score from 20.93 sec to 16 sec to demonstrate improved functional mobility and increased lower extremity strength.  Baseline: 07/03/22 12.16 sec Goal status: MET  6.  Patient will increase right knee mobility to -2 to 120 to promote normal navigation of steps; step over step pattern   Baseline:  Goal status: MET  7. Patient will be able to stand on right leg SLS x 30" to demonstrate improved functional balance   Baseline: 12"  Goal status: in progress   PLAN:  PT FREQUENCY: 2x/week  PT DURATION: 3 weeks  PLANNED INTERVENTIONS: Therapeutic exercises, Therapeutic activity, Neuromuscular re-education, Balance training, Gait training, Patient/Family education, Joint manipulation, Joint mobilization, Stair training, Orthotic/Fit training, DME instructions, Aquatic Therapy, Dry Needling, Electrical stimulation, Spinal manipulation, Spinal mobilization, Cryotherapy, Moist heat, Compression bandaging, scar mobilization, Splintting, Taping, Traction, Ultrasound, Ionotophoresis 4mg /ml Dexamethasone, and Manual therapy   PLAN FOR NEXT SESSION: Progress knee mobility and strength as able; gait training and  balance; continue 2 x a week for 3 weeks to meet unmet and partially met goals.   11:15 AM, 07/03/22 Kirby Cortese Small Plez Belton MPT [facility] [place] [phone]

## 2022-07-03 NOTE — Progress Notes (Signed)
Meds ordered this encounter  Medications   HYDROcodone-acetaminophen (NORCO/VICODIN) 5-325 MG tablet    Sig: Take 1 tablet by mouth every 8 (eight) hours as needed for up to 5 days for moderate pain.    Dispense:  15 tablet    Refill:  0    

## 2022-07-09 DIAGNOSIS — I10 Essential (primary) hypertension: Secondary | ICD-10-CM | POA: Insufficient documentation

## 2022-07-10 ENCOUNTER — Other Ambulatory Visit: Payer: Self-pay | Admitting: Orthopedic Surgery

## 2022-07-10 MED ORDER — HYDROCODONE-ACETAMINOPHEN 5-325 MG PO TABS: 1.0000 | ORAL_TABLET | Freq: Two times a day (BID) | ORAL | 0 refills | Status: DC | PRN

## 2022-07-10 NOTE — Telephone Encounter (Signed)
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  She stated that she is having a bad pain shooting up to her lower back.

## 2022-07-17 ENCOUNTER — Encounter (HOSPITAL_COMMUNITY): Payer: Medicaid Other

## 2022-07-21 ENCOUNTER — Other Ambulatory Visit: Payer: Self-pay | Admitting: Orthopedic Surgery

## 2022-07-21 NOTE — Telephone Encounter (Signed)
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  She stated that she is hurting, aching and throbs at night.

## 2022-07-22 ENCOUNTER — Telehealth (HOSPITAL_COMMUNITY): Payer: Self-pay

## 2022-07-22 ENCOUNTER — Encounter (HOSPITAL_COMMUNITY): Payer: Medicaid Other

## 2022-07-22 NOTE — Telephone Encounter (Signed)
No show, called and left message concerning missed apt today. Included next apt date and time with contact information if needs to cancel/reschedule apts in the future.   Becky Sax, LPTA/CLT; [place] [phone]

## 2022-07-25 ENCOUNTER — Telehealth: Payer: Self-pay | Admitting: Orthopedic Surgery

## 2022-07-25 ENCOUNTER — Ambulatory Visit (INDEPENDENT_AMBULATORY_CARE_PROVIDER_SITE_OTHER): Payer: Medicaid Other | Admitting: Orthopedic Surgery

## 2022-07-25 ENCOUNTER — Encounter: Payer: Self-pay | Admitting: Orthopedic Surgery

## 2022-07-25 VITALS — BP 138/56 | HR 40

## 2022-07-25 DIAGNOSIS — G8918 Other acute postprocedural pain: Secondary | ICD-10-CM

## 2022-07-25 MED ORDER — HYDROCODONE-ACETAMINOPHEN 5-325 MG PO TABS: 1.0000 | ORAL_TABLET | Freq: Every evening | ORAL | 0 refills | Status: AC | PRN

## 2022-07-25 MED ORDER — GABAPENTIN 300 MG PO CAPS: 300.0000 mg | ORAL_CAPSULE | Freq: Every day | ORAL | 5 refills | Status: AC

## 2022-07-25 MED ORDER — TIZANIDINE HCL 4 MG PO TABS: 4.0000 mg | ORAL_TABLET | Freq: Four times a day (QID) | ORAL | 0 refills | Status: AC | PRN

## 2022-07-25 NOTE — Telephone Encounter (Signed)
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  The patient wanted to make [place] aware so when she runs out we'd know what happened.

## 2022-07-25 NOTE — Patient Instructions (Signed)
FOR NIGHT TIME PAIN   TAKE  GABAPENTIN, THE ANTI INFLAMMATORY, THE MUSCLE RELAXER   IF YOU STILL HAVE PAIN TAKE THE HYDROCODONE

## 2022-07-25 NOTE — Progress Notes (Signed)
  POST OP [facility]  Chief Complaint  Patient presents with   Knee Pain    R/ DOS 05/09/22//hurts on outer side of knee. Muscles tighten up at night and keeps me awake with pain.   She was palced on a new anti depressant and had to stop her robaxin   She c/o lateral knee pain and spasms in the quadriceps  ROM is 5 -120  No s/s of infx  Encounter Diagnosis  Name Primary?   Post-op pain Yes     TAKE THE FOLLOWING  MUSCLE RELAXER Gabapentin NSAID 14 OPIOIDS TO TAKE AT NIGHT ONLY   Meds ordered this encounter  Medications   HYDROcodone-acetaminophen (NORCO/VICODIN) 5-325 MG tablet    Sig: Take 1 tablet by mouth at bedtime as needed for up to 14 days for moderate pain.    Dispense:  14 tablet    Refill:  0   gabapentin (NEURONTIN) 300 MG capsule    Sig: Take 1 capsule (300 mg total) by mouth at bedtime.    Dispense:  30 capsule    Refill:  5   tiZANidine (ZANAFLEX) 4 MG tablet    Sig: Take 1 tablet (4 mg total) by mouth every 6 (six) hours as needed for muscle spasms.    Dispense:  30 tablet    Refill:  0   FU 3 months

## 2022-07-29 ENCOUNTER — Ambulatory Visit (HOSPITAL_COMMUNITY): Payer: Medicaid Other | Attending: Orthopedic Surgery

## 2022-07-29 ENCOUNTER — Encounter (HOSPITAL_COMMUNITY): Payer: Self-pay

## 2022-07-29 DIAGNOSIS — M1711 Unilateral primary osteoarthritis, right knee: Secondary | ICD-10-CM | POA: Insufficient documentation

## 2022-07-29 DIAGNOSIS — R262 Difficulty in walking, not elsewhere classified: Secondary | ICD-10-CM | POA: Diagnosis present

## 2022-07-29 DIAGNOSIS — M25661 Stiffness of right knee, not elsewhere classified: Secondary | ICD-10-CM | POA: Diagnosis present

## 2022-07-29 NOTE — Therapy (Signed)
OUTPATIENT PHYSICAL THERAPY LOWER EXTREMITY TREATMENT/PROGRESS NOTE Progress Note Reporting Period 06/11/2022 to 07/03/2022  See note below for Objective Data and Assessment of Progress/Goals.       Patient Name: Taylor Ingram MRN: [id number] DOB:March 22, 1965, 57 y.o., female Today's Date: 07/29/2022  END OF SESSION:  PT End of Session - 07/29/22 1052     Visit Number 6    Number of Visits 12    Date for PT Re-Evaluation 08/26/22    Authorization Type [place] Medicaid [employer];    Authorization Time Period 8 visits from 4/24-->08/10/22    Authorization - Visit Number 5    Authorization - Number of Visits 8    Progress Note Due on Visit 8    PT Start Time 1045    PT Stop Time 1128    PT Time Calculation (min) 43 min    Activity Tolerance Patient tolerated treatment well    Behavior During Therapy WFL for tasks assessed/performed                Past Medical History:  Diagnosis Date   Anxiety    Gout    Hypertension    Panic attack    PONV (postoperative nausea and vomiting)    Pre-diabetes    Past Surgical History:  Procedure Laterality Date   KNEE ARTHROSCOPY     MENISCUS REPAIR     MENISECTOMY     ORTHOPEDIC SURGERY     TOTAL KNEE ARTHROPLASTY Right 05/09/2022   Procedure: TOTAL KNEE ARTHROPLASTY;  Surgeon: Vickki Hearing, MD;  Location: [facility];  Service: Orthopedics;  Laterality: Right;   Patient Active Problem List   Diagnosis Date Noted   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 05/21/2022   Primary osteoarthritis of right knee 05/09/2022   History of arthroscopy of knee 05/14/2017   Loose body in knee, right 04/29/2017   Primary localized osteoarthrosis of right lower leg 10/29/2016    PCP: [facility]  REFERRING PROVIDER: Vickki Hearing, MD  REFERRING DIAG: M17.11 (ICD-10-CM) - Primary localized osteoarthrosis of right lower leg  THERAPY DIAG:  Osteoarthritis of right knee, unspecified osteoarthritis  type  Difficulty in walking, not elsewhere classified  Stiffness of right knee, not elsewhere classified  Rationale for Evaluation and Treatment: Rehabilitation  ONSET DATE: 05/09/2022  SUBJECTIVE:   SUBJECTIVE STATEMENT: 07/29/22:  Saw Dr Romeo Apple and is happy with progress.  Has returned to work for 8 hr now.  No reports of pain today, just some tightness on lateral part of thigh.  Eval:  Ongoing knee pain for years; since 2009; multiple shots and 3 arthroscopic surgeries; finally had TKA per Romeo Apple.  Did not have home health; had CPM and a floor bike  PERTINENT HISTORY: na PAIN:  Are you having pain? Yes: NPRS scale: 5/10 Pain location: right knee Pain description: tight, achey Aggravating factors: movement Relieving factors: pain medication  PRECAUTIONS: None  WEIGHT BEARING RESTRICTIONS: No  FALLS:  Has patient fallen in last 6 months? No  LIVING ENVIRONMENT: Lives with: lives alone Lives in: House/apartment Stairs: Yes: External: 4 steps; none Has following equipment at home: Single point cane  OCCUPATION: culinary school  PLOF: Independent  PATIENT GOALS: get it normal  NEXT MD VISIT: 10/24/22  OBJECTIVE:   DIAGNOSTIC FINDINGS:  Narrative & Impression  CLINICAL DATA:  Status post right total knee replacement.   EXAM: PORTABLE RIGHT KNEE - 1-2 VIEW   COMPARISON:  April 07, 2022.   FINDINGS: The right femoral  and tibial components are well situated. Expected postoperative changes are noted in the soft tissues anteriorly.   IMPRESSION: Status post right total knee arthroplasty.       PATIENT SURVEYS:  LEFS 43/80 53.8%  COGNITION: Overall cognitive status: Within functional limits for tasks assessed     SENSATION: WFL  EDEMA:  Normal for this time s/p  PALPATION:  Area lower min incision tender and red to touch; increased scabbing  LOWER EXTREMITY ROM:  Active ROM Right eval Left eval Right 06/20/22 Right 06/24/22  Right 06/30/22: Right 07/03/22  Hip flexion        Hip extension        Hip abduction        Hip adduction        Hip internal rotation        Hip external rotation        Knee flexion 112 123 112 110 118 120  Knee extension -12 -5 9 lacking 3 2 2   Ankle dorsiflexion        Ankle plantarflexion        Ankle inversion        Ankle eversion         (Blank rows = not tested)  LOWER EXTREMITY MMT:  MMT Right eval Left eval Right 07/03/22  Hip flexion 4 5 5   Hip extension     Hip abduction     Hip adduction     Hip internal rotation     Hip external rotation     Knee flexion (sitting)   5  Knee extension 4+ 5 5  Ankle dorsiflexion 4+ 5 5  Ankle plantarflexion     Ankle inversion     Ankle eversion      (Blank rows = not tested)  FUNCTIONAL TESTS:  5 times sit to stand: 20.93 sec 2 minute walk test: 251 ft with SPC  GAIT: Distance walked: 251 ft Assistive device utilized: Single point cane Level of assistance: Modified independence Comments: antalgic gait; slow gait speed   TODAY'S TREATMENT:                                                                                                                              DATE:  07/29/22: Bike seat 14 x 5'  Rockerboard lateral then DF/PF x 2 min each Tandem stance 1x 30" Tandem stance on foam 2x 30" SLS Lt 20", Rt 15" max of 3 Vector stance 3x 5" Body craft 3Pl 3RT retro and sidestep 262ft with improved mechanics.  07/03/22 Bike seat 14 x 5'  Progress note 5 times sit to stand 12.16 sec 2 MWT  412 ft without AD MMTs and AROM see above LEFS 49/80 61.3 % (goal 53) SLS right 12 sec; left 30 sec  Standing: Alternating lunges 6" box x 10 each Tandem stance on foam beam x 1' each  06/30/22: Bike seat 11 x5' full revoution Standing: Knee drive 5x  10" on 12in step for flexion Squat to heel raise 10x TKE 3Pl 15x 5" Slant board 2x 30" Lateral step up 4in 10x Forward step up 4 then 6in 10x   Leg press 3Pl 10x    Supine: Heel slide AROM 2-118 degrees  06/24/22: Desensitization techniques to improve tolerance with pants, short and sheets   Bike seat 11 x5' full revoution Standing:  TKE 2Pl 15x 5" Rockerboard lateral and DF/PF 2 min each Squats Slant board 2x 30"   Supine: TKE 15x 5" Hamstring stretch with rope 2x 30" AROM 3-110   06/20/22:   Reviewed goals Reviewed importance of HEP compliance Discussed compression garments benefits for edema control  Supine: Quad sets 10x 5" AROM 9-112 Bridge 10x  Standing:  STS 10x eccentric control Squat with HHA TKE yellow band 10x 5" Knee drive 16XW step 5x 10" for flexion Slant board 2x 30"  Seated: LAQ15x Bike seat 11 x5' full revoution  06/11/22 physical therapy evaluation and HEP instruction    PATIENT EDUCATION:  Education details: Patient educated on exam findings, POC, scope of PT, HEP, and what to expect next treatment. Person educated: Patient Education method: Explanation, Demonstration, and Handouts Education comprehension: verbalized understanding, returned demonstration, verbal cues required, and tactile cues required  HOME EXERCISE PROGRAM: Access Code: [email or web address] URL: [email or web address] Date: 06/11/2022 Prepared by: [facility]  Exercises - Supine Ankle Pumps  - 2 x daily - 7 x weekly - 1 sets - 15 reps - Supine Quadricep Sets  - 2 x daily - 7 x weekly - 1 sets - 15 reps - 5 sec hold - Supine Active Straight Leg Raise  - 2 x daily - 7 x weekly - 1 sets - 15 reps - Supine Short Arc Quad  - 2 x daily - 7 x weekly - 1 sets - 15 reps - Supine Heel Slides  - 2 x daily - 7 x weekly - 1 sets - 15 reps - Seated Long Arc Quad  - 2 x daily - 7 x weekly - 1 sets - 15 reps - Seated Knee Flexion Stretch  - 2 x daily - 7 x weekly - 1 sets - 15 reps - Heel Toe Raises with Counter Support  - 2 x daily - 7 x weekly - 1 sets - 10 reps - Mini Squat with Counter Support  - 2 x daily - 7 x weekly - 1 sets - 10 reps -  Standing Hip Extension with Counter Support  - 2 x daily - 7 x weekly - 1 sets - 10 reps - Standing Hip Abduction with Counter Support  - 2 x daily - 7 x weekly - 1 sets - 10 reps  -06/20/22:   Bridge     ASSESSMENT:  CLINICAL IMPRESSION: Pt arrived with antalgic gait mechanics.  Session focus with stability exercises to improve balance, hip stability and gait mechanics.  Most difficulty with SLS based exercises and dynamic surface required intermittent HHA.  Presents with equalized weight bearing with gait mechanics at EOS.  No reports of pain through session.    Eval:  Patient is a 57 y.o. female who was seen today for physical therapy evaluation and treatment for M17.11 (ICD-10-CM) - Primary localized osteoarthrosis of right lower leg. Patient  presents to physical therapy with complaint of Right knee pain and stiffness. Patient demonstrates muscle weakness, reduced ROM, and fascial restrictions which are likely contributing to symptoms of pain and are negatively impacting patient ability to perform  ADLs and functional mobility tasks. Patient will benefit from skilled physical therapy services to address these deficits to reduce pain and improve level of function with ADLs and functional mobility tasks.   OBJECTIVE IMPAIRMENTS: Abnormal gait, decreased activity tolerance, decreased balance, decreased endurance, decreased mobility, difficulty walking, decreased ROM, decreased strength, increased edema, increased fascial restrictions, impaired perceived functional ability, and pain.   ACTIVITY LIMITATIONS: carrying, lifting, bending, sitting, standing, squatting, sleeping, stairs, transfers, bed mobility, bathing, dressing, locomotion level, and caring for others  PARTICIPATION LIMITATIONS: meal prep, cleaning, laundry, shopping, community activity, occupation, and yard work  [facility] POTENTIAL: Good  CLINICAL DECISION MAKING: Stable/uncomplicated  EVALUATION COMPLEXITY: Low   GOALS: Goals  reviewed with patient? No  SHORT TERM GOALS: Target date: 06/25/2022 patient will be independent with initial HEP  Baseline: Goal status: MET  2.  Patient will improve right knee extension to -5 to improve ability to contract quad during stance phase of gait Baseline: -12 Goal status: MET    LONG TERM GOALS: Target date: 07/02/2022  Patient will be independent in self management strategies to improve quality of life and functional outcomes.  Baseline:  Goal status: MET  2.  Patient will improve LEFS score by 10 points to demonstrate improved functional mobility Baseline: 43/80; 49/80 07/03/22 Goal status: IN PROGRESS  3.  Patient will increase  right leg MMTs to 5/5 without pain to promote return to ambulation community distances with minimal deviation.    Baseline: see above Goal status: MET  4.  Patient will increase distance on to 300 ft  to demonstrate improved functional mobility walking household and community distances.   Baseline: 251 ft with SPC; 412 ft without AD Goal status: MET  5.  Patient will improve 5 times sit to stand score from 20.93 sec to 16 sec to demonstrate improved functional mobility and increased lower extremity strength.  Baseline: 07/03/22 12.16 sec Goal status: MET  6.  Patient will increase right knee mobility to -2 to 120 to promote normal navigation of steps; step over step pattern   Baseline:  Goal status: MET  7. Patient will be able to stand on right leg SLS x 30" to demonstrate improved functional balance   Baseline: 12"  Goal status: in progress   PLAN:  PT FREQUENCY: 2x/week  PT DURATION: 3 weeks  PLANNED INTERVENTIONS: Therapeutic exercises, Therapeutic activity, Neuromuscular re-education, Balance training, Gait training, Patient/Family education, Joint manipulation, Joint mobilization, Stair training, Orthotic/Fit training, DME instructions, Aquatic Therapy, Dry Needling, Electrical stimulation, Spinal  manipulation, Spinal mobilization, Cryotherapy, Moist heat, Compression bandaging, scar mobilization, Splintting, Taping, Traction, Ultrasound, Ionotophoresis 4mg /ml Dexamethasone, and Manual therapy   PLAN FOR NEXT SESSION: Progress knee mobility and strength as able; gait training and balance; continue 2 x a week for 3 weeks to meet unmet and partially met goals.   Becky Sax, LPTA/CLT; CBIS [phone]  Juel Burrow, PTA 07/29/2022, 12:40 PM  12:40 PM, 07/29/22

## 2022-07-29 NOTE — Addendum Note (Signed)
Addended byBurnadette Peter, Sophiana Milanese S on: 07/29/2022 11:15 AM   Modules accepted: Orders

## 2022-07-31 ENCOUNTER — Ambulatory Visit (HOSPITAL_COMMUNITY): Payer: Medicaid Other

## 2022-07-31 ENCOUNTER — Encounter (HOSPITAL_COMMUNITY): Payer: Self-pay

## 2022-07-31 DIAGNOSIS — R262 Difficulty in walking, not elsewhere classified: Secondary | ICD-10-CM

## 2022-07-31 DIAGNOSIS — M25661 Stiffness of right knee, not elsewhere classified: Secondary | ICD-10-CM

## 2022-07-31 DIAGNOSIS — M1711 Unilateral primary osteoarthritis, right knee: Secondary | ICD-10-CM

## 2022-07-31 NOTE — Therapy (Signed)
OUTPATIENT PHYSICAL THERAPY LOWER EXTREMITY TREATMENT/  Patient Name: Taylor Ingram MRN: [id number] DOB:April 05, 1965, 57 y.o., female Today's Date: 07/31/2022  END OF SESSION:  PT End of Session - 07/31/22 1142     Visit Number 7    Number of Visits 12    Date for PT Re-Evaluation 08/26/22    Authorization Type [place] Medicaid [employer];    Authorization Time Period 8 visits from 4/24-->08/10/22    Authorization - Visit Number 6    Authorization - Number of Visits 8    Progress Note Due on Visit 8    PT Start Time 1138    PT Stop Time 1222    PT Time Calculation (min) 44 min    Activity Tolerance Patient tolerated treatment well    Behavior During Therapy WFL for tasks assessed/performed                Past Medical History:  Diagnosis Date   Anxiety    Gout    Hypertension    Panic attack    PONV (postoperative nausea and vomiting)    Pre-diabetes    Past Surgical History:  Procedure Laterality Date   KNEE ARTHROSCOPY     MENISCUS REPAIR     MENISECTOMY     ORTHOPEDIC SURGERY     TOTAL KNEE ARTHROPLASTY Right 05/09/2022   Procedure: TOTAL KNEE ARTHROPLASTY;  Surgeon: Vickki Hearing, MD;  Location: [facility];  Service: Orthopedics;  Laterality: Right;   Patient Active Problem List   Diagnosis Date Noted   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 05/21/2022   Primary osteoarthritis of right knee 05/09/2022   History of arthroscopy of knee 05/14/2017   Loose body in knee, right 04/29/2017   Primary localized osteoarthrosis of right lower leg 10/29/2016    PCP: [facility]  REFERRING PROVIDER: Vickki Hearing, MD  REFERRING DIAG: M17.11 (ICD-10-CM) - Primary localized osteoarthrosis of right lower leg  THERAPY DIAG:  Osteoarthritis of right knee, unspecified osteoarthritis type  Difficulty in walking, not elsewhere classified  Stiffness of right knee, not elsewhere classified  Rationale for Evaluation and Treatment:  Rehabilitation  ONSET DATE: 05/09/2022  SUBJECTIVE:   SUBJECTIVE STATEMENT: 07/31/22:  Knee is tight on lateral aspect, pain scale 5/10  Eval:  Ongoing knee pain for years; since 2009; multiple shots and 3 arthroscopic surgeries; finally had TKA per Romeo Apple.  Did not have home health; had CPM and a floor bike  PERTINENT HISTORY: na PAIN:  Are you having pain? Yes: NPRS scale: 5/10 Pain location: right knee Pain description: tight, achey Aggravating factors: movement Relieving factors: pain medication  PRECAUTIONS: None  WEIGHT BEARING RESTRICTIONS: No  FALLS:  Has patient fallen in last 6 months? No  LIVING ENVIRONMENT: Lives with: lives alone Lives in: House/apartment Stairs: Yes: External: 4 steps; none Has following equipment at home: Single point cane  OCCUPATION: culinary school  PLOF: Independent  PATIENT GOALS: get it normal  NEXT MD VISIT: 10/24/22  OBJECTIVE:   DIAGNOSTIC FINDINGS:  Narrative & Impression  CLINICAL DATA:  Status post right total knee replacement.   EXAM: PORTABLE RIGHT KNEE - 1-2 VIEW   COMPARISON:  April 07, 2022.   FINDINGS: The right femoral and tibial components are well situated. Expected postoperative changes are noted in the soft tissues anteriorly.   IMPRESSION: Status post right total knee arthroplasty.       PATIENT SURVEYS:  LEFS 43/80 53.8%  COGNITION: Overall cognitive status: Within functional limits for  tasks assessed     SENSATION: WFL  EDEMA:  Normal for this time s/p  PALPATION:  Area lower min incision tender and red to touch; increased scabbing  LOWER EXTREMITY ROM:  Active ROM Right eval Left eval Right 06/20/22 Right 06/24/22 Right 06/30/22: Right 07/03/22  Hip flexion        Hip extension        Hip abduction        Hip adduction        Hip internal rotation        Hip external rotation        Knee flexion 112 123 112 110 118 120  Knee extension -12 -5 9 lacking 3 2 2   Ankle  dorsiflexion        Ankle plantarflexion        Ankle inversion        Ankle eversion         (Blank rows = not tested)  LOWER EXTREMITY MMT:  MMT Right eval Left eval Right 07/03/22  Hip flexion 4 5 5   Hip extension     Hip abduction     Hip adduction     Hip internal rotation     Hip external rotation     Knee flexion (sitting)   5  Knee extension 4+ 5 5  Ankle dorsiflexion 4+ 5 5  Ankle plantarflexion     Ankle inversion     Ankle eversion      (Blank rows = not tested)  FUNCTIONAL TESTS:  5 times sit to stand: 20.93 sec 2 minute walk test: 251 ft with SPC  GAIT: Distance walked: 251 ft Assistive device utilized: Single point cane Level of assistance: Modified independence Comments: antalgic gait; slow gait speed   TODAY'S TREATMENT:                                                                                                                              DATE:  07/31/22: Bike seat 12 x 5'  SLS Rt 25", Lt 30" max Body craft 3Pl 5RT retro and sidestep Vector stance 3x 5" Stairs 7in reciprocal pattern 5RT no HHA ascending, 2HR descending 6in step down 10x  Instructed self massage with rolling stick 07/29/22: Bike seat 14 x 5'  Rockerboard lateral then DF/PF x 2 min each Tandem stance 1x 30" Tandem stance on foam 2x 30" SLS Lt 20", Rt 15" max of 3 Vector stance 3x 5" Body craft 3Pl 3RT retro and sidestep 279ft with improved mechanics.  07/03/22 Bike seat 14 x 5'  Progress note 5 times sit to stand 12.16 sec 2 MWT  412 ft without AD MMTs and AROM see above LEFS 49/80 61.3 % (goal 53) SLS right 12 sec; left 30 sec  Standing: Alternating lunges 6" box x 10 each Tandem stance on foam beam x 1' each  06/30/22: Bike seat 11 x5' full revoution Standing: Knee drive 5x 10"  on 12in step for flexion Squat to heel raise 10x TKE 3Pl 15x 5" Slant board 2x 30" Lateral step up 4in 10x Forward step up 4 then 6in 10x   Leg press 3Pl 10x   Supine: Heel  slide AROM 2-118 degrees  06/24/22: Desensitization techniques to improve tolerance with pants, short and sheets   Bike seat 11 x5' full revoution Standing:  TKE 2Pl 15x 5" Rockerboard lateral and DF/PF 2 min each Squats Slant board 2x 30"   Supine: TKE 15x 5" Hamstring stretch with rope 2x 30" AROM 3-110   06/20/22:   Reviewed goals Reviewed importance of HEP compliance Discussed compression garments benefits for edema control  Supine: Quad sets 10x 5" AROM 9-112 Bridge 10x  Standing:  STS 10x eccentric control Squat with HHA TKE yellow band 10x 5" Knee drive 54UJ step 5x 10" for flexion Slant board 2x 30"  Seated: LAQ15x Bike seat 11 x5' full revoution  06/11/22 physical therapy evaluation and HEP instruction    PATIENT EDUCATION:  Education details: Patient educated on exam findings, POC, scope of PT, HEP, and what to expect next treatment. Person educated: Patient Education method: Explanation, Demonstration, and Handouts Education comprehension: verbalized understanding, returned demonstration, verbal cues required, and tactile cues required  HOME EXERCISE PROGRAM: Access Code: [email or web address] URL: [email or web address] Date: 06/11/2022 Prepared by: [facility]  Exercises - Supine Ankle Pumps  - 2 x daily - 7 x weekly - 1 sets - 15 reps - Supine Quadricep Sets  - 2 x daily - 7 x weekly - 1 sets - 15 reps - 5 sec hold - Supine Active Straight Leg Raise  - 2 x daily - 7 x weekly - 1 sets - 15 reps - Supine Short Arc Quad  - 2 x daily - 7 x weekly - 1 sets - 15 reps - Supine Heel Slides  - 2 x daily - 7 x weekly - 1 sets - 15 reps - Seated Long Arc Quad  - 2 x daily - 7 x weekly - 1 sets - 15 reps - Seated Knee Flexion Stretch  - 2 x daily - 7 x weekly - 1 sets - 15 reps - Heel Toe Raises with Counter Support  - 2 x daily - 7 x weekly - 1 sets - 10 reps - Mini Squat with Counter Support  - 2 x daily - 7 x weekly - 1 sets - 10 reps - Standing Hip  Extension with Counter Support  - 2 x daily - 7 x weekly - 1 sets - 10 reps - Standing Hip Abduction with Counter Support  - 2 x daily - 7 x weekly - 1 sets - 10 reps  -06/20/22:   Bridge     ASSESSMENT:  CLINICAL IMPRESSION: Continued session focus with stability exercises to improve balance, gait and functional strengthening.  Pt improved SLS compared to last session.  Stair training complete with noted decreased eccentric control stepping down.  Instructed self care with rolling stick to address c/o tightness lateral aspect of knee.  No reports of increased pain through session.  Eval:  Patient is a 57 y.o. female who was seen today for physical therapy evaluation and treatment for M17.11 (ICD-10-CM) - Primary localized osteoarthrosis of right lower leg. Patient  presents to physical therapy with complaint of Right knee pain and stiffness. Patient demonstrates muscle weakness, reduced ROM, and fascial restrictions which are likely contributing to symptoms of pain and are negatively impacting patient ability to  perform ADLs and functional mobility tasks. Patient will benefit from skilled physical therapy services to address these deficits to reduce pain and improve level of function with ADLs and functional mobility tasks.   OBJECTIVE IMPAIRMENTS: Abnormal gait, decreased activity tolerance, decreased balance, decreased endurance, decreased mobility, difficulty walking, decreased ROM, decreased strength, increased edema, increased fascial restrictions, impaired perceived functional ability, and pain.   ACTIVITY LIMITATIONS: carrying, lifting, bending, sitting, standing, squatting, sleeping, stairs, transfers, bed mobility, bathing, dressing, locomotion level, and caring for others  PARTICIPATION LIMITATIONS: meal prep, cleaning, laundry, shopping, community activity, occupation, and yard work  [facility] POTENTIAL: Good  CLINICAL DECISION MAKING: Stable/uncomplicated  EVALUATION COMPLEXITY:  Low   GOALS: Goals reviewed with patient? No  SHORT TERM GOALS: Target date: 06/25/2022 patient will be independent with initial HEP  Baseline: Goal status: MET  2.  Patient will improve right knee extension to -5 to improve ability to contract quad during stance phase of gait Baseline: -12 Goal status: MET    LONG TERM GOALS: Target date: 07/02/2022  Patient will be independent in self management strategies to improve quality of life and functional outcomes.  Baseline:  Goal status: MET  2.  Patient will improve LEFS score by 10 points to demonstrate improved functional mobility Baseline: 43/80; 49/80 07/03/22 Goal status: IN PROGRESS  3.  Patient will increase  right leg MMTs to 5/5 without pain to promote return to ambulation community distances with minimal deviation.    Baseline: see above Goal status: MET  4.  Patient will increase distance on to 300 ft  to demonstrate improved functional mobility walking household and community distances.   Baseline: 251 ft with SPC; 412 ft without AD Goal status: MET  5.  Patient will improve 5 times sit to stand score from 20.93 sec to 16 sec to demonstrate improved functional mobility and increased lower extremity strength.  Baseline: 07/03/22 12.16 sec Goal status: MET  6.  Patient will increase right knee mobility to -2 to 120 to promote normal navigation of steps; step over step pattern   Baseline:  Goal status: MET  7. Patient will be able to stand on right leg SLS x 30" to demonstrate improved functional balance   Baseline: 12"  Goal status: in progress   PLAN:  PT FREQUENCY: 2x/week  PT DURATION: 3 weeks  PLANNED INTERVENTIONS: Therapeutic exercises, Therapeutic activity, Neuromuscular re-education, Balance training, Gait training, Patient/Family education, Joint manipulation, Joint mobilization, Stair training, Orthotic/Fit training, DME instructions, Aquatic Therapy, Dry Needling, Electrical  stimulation, Spinal manipulation, Spinal mobilization, Cryotherapy, Moist heat, Compression bandaging, scar mobilization, Splintting, Taping, Traction, Ultrasound, Ionotophoresis 4mg /ml Dexamethasone, and Manual therapy   PLAN FOR NEXT SESSION: Progress knee mobility and strength as able; gait training and balance; continue 2 x a week for 3 weeks to meet unmet and partially met goals. Pt will need new cert following 2 more sessions for additional sessions based upon progress note on 07/03/22.  Becky Sax, LPTA/CLT; CBIS [phone]  Juel Burrow, PTA 07/31/2022, 1:18 PM  1:18 PM, 07/31/22

## 2022-08-04 ENCOUNTER — Ambulatory Visit (HOSPITAL_COMMUNITY): Payer: Medicaid Other

## 2022-08-04 DIAGNOSIS — R262 Difficulty in walking, not elsewhere classified: Secondary | ICD-10-CM

## 2022-08-04 DIAGNOSIS — M1711 Unilateral primary osteoarthritis, right knee: Secondary | ICD-10-CM | POA: Diagnosis not present

## 2022-08-04 DIAGNOSIS — M25661 Stiffness of right knee, not elsewhere classified: Secondary | ICD-10-CM

## 2022-08-04 NOTE — Therapy (Signed)
OUTPATIENT PHYSICAL THERAPY LOWER EXTREMITY TREATMENT/  Patient Name: Taylor Ingram MRN: [id number] DOB:1965/09/22, 57 y.o., female Today's Date: 08/04/2022  END OF SESSION:  PT End of Session - 08/04/22 1437     Visit Number 8    Number of Visits 12    Date for PT Re-Evaluation 08/26/22    Authorization Type [place] [employer];    Authorization Time Period 8 visits from 4/24-->08/10/22    Authorization - Visit Number 7    Authorization - Number of Visits 8    Progress Note Due on Visit 8    PT Start Time 0237    PT Stop Time 0315    PT Time Calculation (min) 38 min    Activity Tolerance Patient tolerated treatment well    Behavior During Therapy WFL for tasks assessed/performed                Past Medical History:  Diagnosis Date   Anxiety    Gout    Hypertension    Panic attack    PONV (postoperative nausea and vomiting)    Pre-diabetes    Past Surgical History:  Procedure Laterality Date   KNEE ARTHROSCOPY     MENISCUS REPAIR     MENISECTOMY     ORTHOPEDIC SURGERY     TOTAL KNEE ARTHROPLASTY Right 05/09/2022   Procedure: TOTAL KNEE ARTHROPLASTY;  Surgeon: Vickki Hearing, MD;  Location: [facility];  Service: Orthopedics;  Laterality: Right;   Patient Active Problem List   Diagnosis Date Noted   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 05/21/2022   Primary osteoarthritis of right knee 05/09/2022   History of arthroscopy of knee 05/14/2017   Loose body in knee, right 04/29/2017   Primary localized osteoarthrosis of right lower leg 10/29/2016    [facility]P: [facility]  REFERRING PROVIDER: Vickki Hearing, MD  REFERRING DIAG: M17.11 (ICD-10-CM) - Primary localized osteoarthrosis of right lower leg  THERAPY DIAG:  Osteoarthritis of right knee, unspecified osteoarthritis type  Difficulty in walking, not elsewhere classified  Stiffness of right knee, not elsewhere classified  Rationale for Evaluation and Treatment:  Rehabilitation  ONSET DATE: 05/09/2022  SUBJECTIVE:   SUBJECTIVE STATEMENT: 08/04/22 Patient's chief complaint is now tightness; especially when she first gets up in the morning and after sitting for a prolonged period of time.   Eval:  Ongoing knee pain for years; since 2009; multiple shots and 3 arthroscopic surgeries; finally had TKA per Romeo Apple.  Did not have home health; had CPM and a floor bike  PERTINENT HISTORY: na PAIN:  Are you having pain? Yes: NPRS scale: 5/10 Pain location: right knee Pain description: tight, achey Aggravating factors: movement Relieving factors: pain medication  PRECAUTIONS: None  WEIGHT BEARING RESTRICTIONS: No  FALLS:  Has patient fallen in last 6 months? No  LIVING ENVIRONMENT: Lives with: lives alone Lives in: House/apartment Stairs: Yes: External: 4 steps; none Has following equipment at home: Single point cane  OCCUPATION: culinary school  PLOF: Independent  PATIENT GOALS: get it normal  NEXT MD VISIT: 10/24/22  OBJECTIVE:   DIAGNOSTIC FINDINGS:  Narrative & Impression  CLINICAL DATA:  Status post right total knee replacement.   EXAM: PORTABLE RIGHT KNEE - 1-2 VIEW   COMPARISON:  April 07, 2022.   FINDINGS: The right femoral and tibial components are well situated. Expected postoperative changes are noted in the soft tissues anteriorly.   IMPRESSION: Status post right total knee arthroplasty.  PATIENT SURVEYS:  LEFS 43/80 53.8%  COGNITION: Overall cognitive status: Within functional limits for tasks assessed     SENSATION: WFL  EDEMA:  Normal for this time s/p  PALPATION:  Area lower min incision tender and red to touch; increased scabbing  LOWER EXTREMITY ROM:  Active ROM Right eval Left eval Right 06/20/22 Right 06/24/22 Right 06/30/22: Right 07/03/22  Hip flexion        Hip extension        Hip abduction        Hip adduction        Hip internal rotation        Hip external rotation         Knee flexion 112 123 112 110 118 120  Knee extension -12 -5 9 lacking 3 2 2   Ankle dorsiflexion        Ankle plantarflexion        Ankle inversion        Ankle eversion         (Blank rows = not tested)  LOWER EXTREMITY MMT:  MMT Right eval Left eval Right 07/03/22  Hip flexion 4 5 5   Hip extension     Hip abduction     Hip adduction     Hip internal rotation     Hip external rotation     Knee flexion (sitting)   5  Knee extension 4+ 5 5  Ankle dorsiflexion 4+ 5 5  Ankle plantarflexion     Ankle inversion     Ankle eversion      (Blank rows = not tested)  FUNCTIONAL TESTS:  5 times sit to stand: 20.93 sec 2 minute walk test: 251 ft with SPC  GAIT: Distance walked: 251 ft Assistive device utilized: Single point cane Level of assistance: Modified independence Comments: antalgic gait; slow gait speed   TODAY'S TREATMENT:                                                                                                                              DATE:  08/04/22 Bike seat 12 x 5' dynamic warm up  Standing: Knee drives for flexion x 2' Step downs 6" step x 10 Tandem stance 3 x 30" each SLS 3 x 15" each Heel raises on incline x 10 Toe raises on incline x 10 Hip vectors x 5 each   07/31/22: Bike seat 12 x 5'  SLS Rt 25", Lt 30" max Body craft 3Pl 5RT retro and sidestep Vector stance 3x 5" Stairs 7in reciprocal pattern 5RT no HHA ascending, 2HR descending 6in step down 10x  Instructed self massage with rolling stick 07/29/22: Bike seat 14 x 5'  Rockerboard lateral then DF/PF x 2 min each Tandem stance 1x 30" Tandem stance on foam 2x 30" SLS Lt 20", Rt 15" max of 3 Vector stance 3x 5" Body craft 3Pl 3RT retro and sidestep 287ft with improved mechanics.  07/03/22 Bike seat  14 x 5'  Progress note 5 times sit to stand 12.16 sec 2 MWT  412 ft without AD MMTs and AROM see above LEFS 49/80 61.3 % (goal 53) SLS right 12 sec; left 30  sec  Standing: Alternating lunges 6" box x 10 each Tandem stance on foam beam x 1' each  06/30/22: Bike seat 11 x5' full revoution Standing: Knee drive 5x 10" on 16XW step for flexion Squat to heel raise 10x TKE 3Pl 15x 5" Slant board 2x 30" Lateral step up 4in 10x Forward step up 4 then 6in 10x   Leg press 3Pl 10x   Supine: Heel slide AROM 2-118 degrees  06/24/22: Desensitization techniques to improve tolerance with pants, short and sheets   Bike seat 11 x5' full revoution Standing:  TKE 2Pl 15x 5" Rockerboard lateral and DF/PF 2 min each Squats Slant board 2x 30"   Supine: TKE 15x 5" Hamstring stretch with rope 2x 30" AROM 3-110   06/20/22:   Reviewed goals Reviewed importance of HEP compliance Discussed compression garments benefits for edema control  Supine: Quad sets 10x 5" AROM 9-112 Bridge 10x  Standing:  STS 10x eccentric control Squat with HHA TKE yellow band 10x 5" Knee drive 96EA step 5x 10" for flexion Slant board 2x 30"  Seated: LAQ15x Bike seat 11 x5' full revoution  06/11/22 physical therapy evaluation and HEP instruction    PATIENT EDUCATION:  Education details: Patient educated on exam findings, POC, scope of PT, HEP, and what to expect next treatment. Person educated: Patient Education method: Explanation, Demonstration, and Handouts Education comprehension: verbalized understanding, returned demonstration, verbal cues required, and tactile cues required  HOME EXERCISE PROGRAM: Access Code: [email or web address] URL: [email or web address] Date: 06/11/2022 Prepared by: [facility]  Exercises - Supine Ankle Pumps  - 2 x daily - 7 x weekly - 1 sets - 15 reps - Supine Quadricep Sets  - 2 x daily - 7 x weekly - 1 sets - 15 reps - 5 sec hold - Supine Active Straight Leg Raise  - 2 x daily - 7 x weekly - 1 sets - 15 reps - Supine Short Arc Quad  - 2 x daily - 7 x weekly - 1 sets - 15 reps - Supine Heel Slides  - 2 x daily - 7 x weekly  - 1 sets - 15 reps - Seated Long Arc Quad  - 2 x daily - 7 x weekly - 1 sets - 15 reps - Seated Knee Flexion Stretch  - 2 x daily - 7 x weekly - 1 sets - 15 reps - Heel Toe Raises with Counter Support  - 2 x daily - 7 x weekly - 1 sets - 10 reps - Mini Squat with Counter Support  - 2 x daily - 7 x weekly - 1 sets - 10 reps - Standing Hip Extension with Counter Support  - 2 x daily - 7 x weekly - 1 sets - 10 reps - Standing Hip Abduction with Counter Support  - 2 x daily - 7 x weekly - 1 sets - 10 reps  -06/20/22:   Bridge     ASSESSMENT:  CLINICAL IMPRESSION: Continued session focus with stability exercises to improve balance, gait and functional strengthening. Patient continues with max challenge with balance exercises; has most difficulty with dorsiflexion/toe raises with balance.  Patient will benefit from continued skilled therapy services  to address deficits and promote return to optimal function.      Eval:  Patient is a 57 y.o. female who was seen today for physical therapy evaluation and treatment for M17.11 (ICD-10-CM) - Primary localized osteoarthrosis of right lower leg. Patient  presents to physical therapy with complaint of Right knee pain and stiffness. Patient demonstrates muscle weakness, reduced ROM, and fascial restrictions which are likely contributing to symptoms of pain and are negatively impacting patient ability to perform ADLs and functional mobility tasks. Patient will benefit from skilled physical therapy services to address these deficits to reduce pain and improve level of function with ADLs and functional mobility tasks.   OBJECTIVE IMPAIRMENTS: Abnormal gait, decreased activity tolerance, decreased balance, decreased endurance, decreased mobility, difficulty walking, decreased ROM, decreased strength, increased edema, increased fascial restrictions, impaired perceived functional ability, and pain.   ACTIVITY LIMITATIONS: carrying, lifting, bending, sitting,  standing, squatting, sleeping, stairs, transfers, bed mobility, bathing, dressing, locomotion level, and caring for others  PARTICIPATION LIMITATIONS: meal prep, cleaning, laundry, shopping, community activity, occupation, and yard work  [facility] POTENTIAL: Good  CLINICAL DECISION MAKING: Stable/uncomplicated  EVALUATION COMPLEXITY: Low   GOALS: Goals reviewed with patient? No  SHORT TERM GOALS: Target date: 06/25/2022 patient will be independent with initial HEP  Baseline: Goal status: MET  2.  Patient will improve right knee extension to -5 to improve ability to contract quad during stance phase of gait Baseline: -12 Goal status: MET    LONG TERM GOALS: Target date: 07/02/2022  Patient will be independent in self management strategies to improve quality of life and functional outcomes.  Baseline:  Goal status: MET  2.  Patient will improve LEFS score by 10 points to demonstrate improved functional mobility Baseline: 43/80; 49/80 07/03/22 Goal status: IN PROGRESS  3.  Patient will increase  right leg MMTs to 5/5 without pain to promote return to ambulation community distances with minimal deviation.    Baseline: see above Goal status: MET  4.  Patient will increase distance on to 300 ft  to demonstrate improved functional mobility walking household and community distances.   Baseline: 251 ft with SPC; 412 ft without AD Goal status: MET  5.  Patient will improve 5 times sit to stand score from 20.93 sec to 16 sec to demonstrate improved functional mobility and increased lower extremity strength.  Baseline: 07/03/22 12.16 sec Goal status: MET  6.  Patient will increase right knee mobility to -2 to 120 to promote normal navigation of steps; step over step pattern   Baseline:  Goal status: MET  7. Patient will be able to stand on right leg SLS x 30" to demonstrate improved functional balance   Baseline: 12"  Goal status: in progress   PLAN:  PT  FREQUENCY: 2x/week  PT DURATION: 3 weeks  PLANNED INTERVENTIONS: Therapeutic exercises, Therapeutic activity, Neuromuscular re-education, Balance training, Gait training, Patient/Family education, Joint manipulation, Joint mobilization, Stair training, Orthotic/Fit training, DME instructions, Aquatic Therapy, Dry Needling, Electrical stimulation, Spinal manipulation, Spinal mobilization, Cryotherapy, Moist heat, Compression bandaging, scar mobilization, Splintting, Taping, Traction, Ultrasound, Ionotophoresis 4mg /ml Dexamethasone, and Manual therapy   PLAN FOR NEXT SESSION: Progress knee mobility and strength as able; gait training and balance; continue 2 x a week for 3 weeks to meet unmet and partially met goals. Pt will need new cert following 2 more sessions for additional sessions based upon progress note on 07/03/22.  3:16 PM, 08/04/22 Deaundre Allston Small Carina Chaplin MPT [facility] [place] [phone]

## 2022-08-08 ENCOUNTER — Encounter (HOSPITAL_COMMUNITY): Payer: Medicaid Other

## 2022-08-19 ENCOUNTER — Encounter (HOSPITAL_COMMUNITY): Payer: Medicaid Other

## 2022-08-28 ENCOUNTER — Encounter (HOSPITAL_COMMUNITY): Payer: Medicaid Other

## 2022-09-03 ENCOUNTER — Encounter (HOSPITAL_COMMUNITY): Payer: Medicaid Other

## 2022-09-04 ENCOUNTER — Telehealth (HOSPITAL_COMMUNITY): Payer: Self-pay

## 2022-09-04 ENCOUNTER — Ambulatory Visit (HOSPITAL_COMMUNITY): Payer: Medicaid Other | Attending: Orthopedic Surgery

## 2022-09-04 NOTE — Telephone Encounter (Signed)
No show, called and left message concerning missed apt today. Reminded next apt date and time. Requested pt to call and inform if she will attend next apt or cancel for DC to HEP.   Becky Sax, LPTA/CLT; [place] [phone]

## 2022-09-11 ENCOUNTER — Encounter (HOSPITAL_COMMUNITY): Payer: Self-pay | Admitting: Physical Therapy

## 2022-09-11 ENCOUNTER — Telehealth (HOSPITAL_COMMUNITY): Payer: Self-pay | Admitting: Physical Therapy

## 2022-09-11 ENCOUNTER — Ambulatory Visit (HOSPITAL_COMMUNITY): Payer: Medicaid Other | Admitting: Physical Therapy

## 2022-09-11 NOTE — Therapy (Unsigned)
PHYSICAL THERAPY DISCHARGE SUMMARY  Visits from Start of Care: 8  Current functional level related to goals / functional outcomes: Unknown as pt has not returned since 6/17   Remaining deficits: Unknown as pt has not returned since 6/17   Education / Equipment: HEP   Patient agrees to discharge. Patient goals were met. Patient is being discharged due to not returning since the last visit.   All STG and 6/7 LTG have been met  Virgina Organ, PT CLT [phone]

## 2022-09-11 NOTE — Telephone Encounter (Signed)
3rd No show  Pt called.  No answer.  Left a message that she will be discharged and will need a new order if she wants to return to therapy.  Virgina Organ, PT CLT [phone]

## 2022-09-15 ENCOUNTER — Ambulatory Visit: Payer: Medicaid Other | Admitting: Nutrition

## 2022-10-24 ENCOUNTER — Ambulatory Visit (INDEPENDENT_AMBULATORY_CARE_PROVIDER_SITE_OTHER): Payer: Medicaid Other | Admitting: Orthopedic Surgery

## 2022-10-24 ENCOUNTER — Encounter: Payer: Self-pay | Admitting: Orthopedic Surgery

## 2022-10-24 VITALS — BP 130/77 | HR 73

## 2022-10-24 DIAGNOSIS — Z96651 Presence of right artificial knee joint: Secondary | ICD-10-CM | POA: Diagnosis not present

## 2022-10-24 NOTE — Progress Notes (Signed)
Chief Complaint  Patient presents with   Follow-up    Follow up right TKR 05/09/22   Encounter Diagnosis  Name Primary?   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 Yes    This is a follow-up visit the patient had a knee replacement 6 months ago.  She had a little bit of a hiccup during the postop course with some lateral pain  She says she is basically pain-free now she is not using any support to walk she is happy with her knee replacement  She has full extension she has 125 degrees of flexion there are no signs of infection  She will follow-up in March for her annual 1 year x-ray

## 2022-11-11 ENCOUNTER — Telehealth: Payer: Self-pay

## 2022-11-11 DIAGNOSIS — G8918 Other acute postprocedural pain: Secondary | ICD-10-CM

## 2022-11-11 NOTE — Telephone Encounter (Signed)
I will ask him about meds but we need to see her too she had TKR and if she fell she needs to come in next available please.

## 2022-11-11 NOTE — Telephone Encounter (Signed)
He wants to see her tomorrow

## 2022-11-11 NOTE — Telephone Encounter (Signed)
Patient called stating that she had R knee surgery 05/09/22. Stated that she fell getting out of the shower on Sunday 11/09/22 and landed on both knees.  States that she is having excruciating pain and nerve discomfort. She doesn't think anything is broken because she can move, bend and squat. Has been using Ice, elevating it and taking Ibuprofen which is not helping. States that she has no popping or anything like that. She is asking if Dr. Romeo Apple would call her in something for pain and nerve discomfort. Stated he gave her Gabapentin and Hydrocodone before and wondered if he would try it again.  Stated she didn't think she needed to be seen unless Dr. Romeo Apple thought so.  PATIENT USES [facility]

## 2022-11-12 ENCOUNTER — Ambulatory Visit: Payer: Medicaid Other | Admitting: Orthopedic Surgery

## 2023-05-08 ENCOUNTER — Encounter: Payer: Self-pay | Admitting: Orthopedic Surgery

## 2023-05-08 ENCOUNTER — Ambulatory Visit: Payer: Medicaid Other | Admitting: Orthopedic Surgery

## 2023-05-08 ENCOUNTER — Other Ambulatory Visit (INDEPENDENT_AMBULATORY_CARE_PROVIDER_SITE_OTHER): Payer: Self-pay

## 2023-05-08 VITALS — BP 140/90 | HR 81 | Ht 68.0 in | Wt 266.0 lb

## 2023-05-08 DIAGNOSIS — M1711 Unilateral primary osteoarthritis, right knee: Secondary | ICD-10-CM

## 2023-05-08 DIAGNOSIS — Z96651 Presence of right artificial knee joint: Secondary | ICD-10-CM | POA: Diagnosis not present

## 2023-05-08 NOTE — Progress Notes (Signed)
 ANNUAL FOLLOW UP FOR  RIGHT  TKA   Chief Complaint  Patient presents with   Post-op Follow-up    Right knee replaced 05/09/22     HPI: The patient is here for the annual  follow-up x-ray for knee replacement. The patient is not complaining of pain weakness instability or stiffness in the repaired knee.   ROS NO PAIN LEFT KNEE      Examination of the RIGHT  KNEE  BP (!) 140/90   Pulse 81   Ht 5\' 8"  (1.727 m)   Wt 266 lb (120.7 kg)   LMP 07/05/2011   BMI 40.45 kg/m  General the patient is normally groomed in no distress Inspection shows : incision healed nicely without erythema, no tenderness no swelling Range of motion total range of motion is 0-120 Stability the knee is stable anterior to posterior as well as medial to lateral Strength quadriceps strength is normal Skin no erythema around the skin incision Neuro: normal sensation in the operative leg  Gait: normal expected gait without cane    Medical decision-making section X-rays ordered, internal imaging shows (see full dictated report) stable implant with no signs of loosening  Diagnosis  Encounter Diagnosis  Name Primary?   Primary osteoarthritis of right knee Yes     Plan follow-up 1 year repeat x-rays
